# Patient Record
Sex: Female | Born: 1980 | Race: Black or African American | Hispanic: No | Marital: Single | Smoking: Former smoker
Health system: Southern US, Community
[De-identification: ages and names within clinical notes are randomized; demographics above are authoritative.]

## PROBLEM LIST (undated history)

## (undated) DIAGNOSIS — R87629 Unspecified abnormal cytological findings in specimens from vagina: Secondary | ICD-10-CM

## (undated) DIAGNOSIS — R569 Unspecified convulsions: Secondary | ICD-10-CM

## (undated) DIAGNOSIS — F419 Anxiety disorder, unspecified: Secondary | ICD-10-CM

## (undated) DIAGNOSIS — N83209 Unspecified ovarian cyst, unspecified side: Secondary | ICD-10-CM

## (undated) DIAGNOSIS — F329 Major depressive disorder, single episode, unspecified: Secondary | ICD-10-CM

## (undated) DIAGNOSIS — F32A Depression, unspecified: Secondary | ICD-10-CM

## (undated) DIAGNOSIS — G43909 Migraine, unspecified, not intractable, without status migrainosus: Secondary | ICD-10-CM

## (undated) HISTORY — DX: Unspecified abnormal cytological findings in specimens from vagina: R87.629

## (undated) HISTORY — DX: Unspecified convulsions: R56.9

## (undated) HISTORY — DX: Migraine, unspecified, not intractable, without status migrainosus: G43.909

## (undated) HISTORY — PX: WISDOM TOOTH EXTRACTION: SHX21

---

## 2006-02-14 DIAGNOSIS — R87629 Unspecified abnormal cytological findings in specimens from vagina: Secondary | ICD-10-CM

## 2006-02-14 HISTORY — PX: GYNECOLOGIC CRYOSURGERY: SHX857

## 2006-02-14 HISTORY — DX: Unspecified abnormal cytological findings in specimens from vagina: R87.629

## 2016-11-12 DIAGNOSIS — F329 Major depressive disorder, single episode, unspecified: Secondary | ICD-10-CM | POA: Insufficient documentation

## 2016-11-12 DIAGNOSIS — F419 Anxiety disorder, unspecified: Secondary | ICD-10-CM | POA: Insufficient documentation

## 2016-11-12 DIAGNOSIS — F32A Depression, unspecified: Secondary | ICD-10-CM | POA: Insufficient documentation

## 2016-12-30 ENCOUNTER — Emergency Department (HOSPITAL_COMMUNITY)
Admission: EM | Admit: 2016-12-30 | Discharge: 2016-12-30 | Disposition: A | Discharge status: Home / Self Care | Payer: Managed Care, Other (non HMO) | Attending: Emergency Medicine | Admitting: Emergency Medicine

## 2016-12-30 ENCOUNTER — Emergency Department (HOSPITAL_COMMUNITY): Payer: Managed Care, Other (non HMO)

## 2016-12-30 ENCOUNTER — Encounter (HOSPITAL_COMMUNITY): Payer: Self-pay | Admitting: Nurse Practitioner

## 2016-12-30 DIAGNOSIS — R1031 Right lower quadrant pain: Secondary | ICD-10-CM | POA: Diagnosis present

## 2016-12-30 DIAGNOSIS — R112 Nausea with vomiting, unspecified: Secondary | ICD-10-CM | POA: Insufficient documentation

## 2016-12-30 DIAGNOSIS — N83201 Unspecified ovarian cyst, right side: Secondary | ICD-10-CM | POA: Diagnosis not present

## 2016-12-30 DIAGNOSIS — F17210 Nicotine dependence, cigarettes, uncomplicated: Secondary | ICD-10-CM | POA: Diagnosis not present

## 2016-12-30 HISTORY — DX: Depression, unspecified: F32.A

## 2016-12-30 HISTORY — DX: Anxiety disorder, unspecified: F41.9

## 2016-12-30 HISTORY — DX: Major depressive disorder, single episode, unspecified: F32.9

## 2016-12-30 LAB — CBC
HCT: 39.7 % (ref 36.0–46.0)
Hemoglobin: 13.6 g/dL (ref 12.0–15.0)
MCH: 30.4 pg (ref 26.0–34.0)
MCHC: 34.3 g/dL (ref 30.0–36.0)
MCV: 88.8 fL (ref 78.0–100.0)
PLATELETS: 178 10*3/uL (ref 150–400)
RBC: 4.47 MIL/uL (ref 3.87–5.11)
RDW: 12.3 % (ref 11.5–15.5)
WBC: 4.7 10*3/uL (ref 4.0–10.5)

## 2016-12-30 LAB — URINALYSIS, ROUTINE W REFLEX MICROSCOPIC
BILIRUBIN URINE: NEGATIVE
Glucose, UA: NEGATIVE mg/dL
Ketones, ur: NEGATIVE mg/dL
LEUKOCYTES UA: NEGATIVE
NITRITE: NEGATIVE
PH: 5 (ref 5.0–8.0)
Protein, ur: NEGATIVE mg/dL
SPECIFIC GRAVITY, URINE: 1.025 (ref 1.005–1.030)

## 2016-12-30 LAB — COMPREHENSIVE METABOLIC PANEL
ALBUMIN: 4.1 g/dL (ref 3.5–5.0)
ALK PHOS: 67 U/L (ref 38–126)
ALT: 15 U/L (ref 14–54)
AST: 21 U/L (ref 15–41)
Anion gap: 6 (ref 5–15)
BILIRUBIN TOTAL: 0.5 mg/dL (ref 0.3–1.2)
BUN: 10 mg/dL (ref 6–20)
CALCIUM: 9.3 mg/dL (ref 8.9–10.3)
CO2: 24 mmol/L (ref 22–32)
CREATININE: 0.95 mg/dL (ref 0.44–1.00)
Chloride: 106 mmol/L (ref 101–111)
GFR calc Af Amer: >60 mL/min (ref >60)
GLUCOSE: 102 mg/dL — AB (ref 65–99)
POTASSIUM: 3.5 mmol/L (ref 3.5–5.1)
Sodium: 136 mmol/L (ref 135–145)
TOTAL PROTEIN: 7 g/dL (ref 6.5–8.1)

## 2016-12-30 LAB — I-STAT BETA HCG BLOOD, ED (MC, WL, AP ONLY)

## 2016-12-30 LAB — LIPASE, BLOOD: Lipase: 45 U/L (ref 11–51)

## 2016-12-30 MED ORDER — KETOROLAC TROMETHAMINE 15 MG/ML IJ SOLN
15.0000 mg | Freq: Once | INTRAMUSCULAR | Status: AC
  Administered 2016-12-30: 15 mg via INTRAVENOUS
  Filled 2016-12-30: qty 1

## 2016-12-30 MED ORDER — SODIUM CHLORIDE 0.9 % IV BOLUS (SEPSIS)
1000.0000 mL | Freq: Once | INTRAVENOUS | Status: AC
  Administered 2016-12-30: 1000 mL via INTRAVENOUS

## 2016-12-30 MED ORDER — ONDANSETRON HCL 4 MG/2ML IJ SOLN
4.0000 mg | Freq: Once | INTRAMUSCULAR | Status: AC
  Administered 2016-12-30: 4 mg via INTRAVENOUS
  Filled 2016-12-30: qty 2

## 2016-12-30 MED ORDER — NAPROXEN 500 MG PO TABS: 500.0000 mg | ORAL_TABLET | Freq: Two times a day (BID) | ORAL | 0 refills | Status: AC | PRN

## 2016-12-30 MED ORDER — HYDROCODONE-ACETAMINOPHEN 5-325 MG PO TABS
2.0000 | ORAL_TABLET | Freq: Once | ORAL | Status: DC
  Filled 2016-12-30: qty 2

## 2016-12-30 MED ORDER — DIPHENHYDRAMINE HCL 50 MG/ML IJ SOLN: 12.5000 mg | Freq: Once | INTRAMUSCULAR | Status: DC

## 2016-12-30 MED ORDER — ONDANSETRON 4 MG PO TBDP
4.0000 mg | ORAL_TABLET | Freq: Once | ORAL | Status: AC | PRN
  Administered 2016-12-30: 4 mg via ORAL
  Filled 2016-12-30: qty 1

## 2016-12-30 MED ORDER — MORPHINE SULFATE (PF) 4 MG/ML IV SOLN
4.0000 mg | Freq: Once | INTRAVENOUS | Status: AC
  Administered 2016-12-30: 4 mg via INTRAVENOUS
  Filled 2016-12-30: qty 1

## 2016-12-30 MED ORDER — IOPAMIDOL (ISOVUE-300) INJECTION 61%
INTRAVENOUS | Status: AC
  Administered 2016-12-30: 100 mL
  Filled 2016-12-30: qty 100

## 2016-12-30 MED ORDER — METOCLOPRAMIDE HCL 5 MG/ML IJ SOLN: 10.0000 mg | Freq: Once | INTRAMUSCULAR | Status: DC

## 2016-12-30 MED ORDER — HYDROCODONE-ACETAMINOPHEN 5-325 MG PO TABS: 1.0000 | ORAL_TABLET | ORAL | 0 refills | Status: DC | PRN

## 2016-12-30 MED ORDER — ONDANSETRON 4 MG PO TBDP: 4.0000 mg | ORAL_TABLET | Freq: Three times a day (TID) | ORAL | 0 refills | Status: DC | PRN

## 2016-12-30 NOTE — ED Provider Notes (Signed)
[facility]   CSN: [id number] Arrival date & time: 12/30/16  [id number]     History   Chief Complaint Chief Complaint  Patient presents with  . Abdominal Pain    HPI Chelsea Wolf is a 36 y.o. female.  HPI   36 yo F with PMHx anxiety, depression here with abdominal pain. Pt reports several days of progressively worsening RLQ abdominal pain. Pt reports aching, gnawing, severe RLQ pain with associated nausea, vomiting. Pain is worse with eating and palpation. No worsening factors. No fever or chills. No vaginal bleeding or discharge. No flank pain. No h/o gallstones and had neg U/S several months ago according to her report. She was seen by her PCP and sent here for CT/evaluation. No h/o abdominal surgeries.  Past Medical History:  Diagnosis Date  . Anxiety   . Depression     There are no active problems to display for this patient.   Past Surgical History:  Procedure Laterality Date  . WISDOM TOOTH EXTRACTION      OB History    No data available       Home Medications    Prior to Admission medications   Medication Sig Start Date End Date Taking? Authorizing Provider  HYDROcodone-acetaminophen (NORCO/VICODIN) 5-325 MG tablet Take 1-2 tablets every 4 (four) hours as needed by mouth for moderate pain or severe pain. 12/30/16   Shaune PollackIsaacs, Sakari Raisanen, MD  naproxen (NAPROSYN) 500 MG tablet Take 1 tablet (500 mg total) 2 (two) times daily as needed for up to 7 days by mouth for moderate pain. 12/30/16 01/06/17  Shaune PollackIsaacs, Jahnavi Muratore, MD  ondansetron (ZOFRAN ODT) 4 MG disintegrating tablet Take 1 tablet (4 mg total) every 8 (eight) hours as needed by mouth for nausea or vomiting. 12/30/16   Shaune PollackIsaacs, Kelley Polinsky, MD    Family History No family history on file.  Social History Social History   Tobacco Use  . Smoking status: Light Tobacco Smoker  . Smokeless tobacco: Never Used  Substance Use Topics  . Alcohol use: Yes    Comment:  occasionally   . Drug use: No     Allergies   Demerol [meperidine]   Review of Systems Review of Systems  Constitutional: Positive for fatigue. Negative for chills and fever.  HENT: Negative for congestion and rhinorrhea.   Eyes: Negative for visual disturbance.  Respiratory: Negative for cough, shortness of breath and wheezing.   Cardiovascular: Negative for chest pain and leg swelling.  Gastrointestinal: Positive for abdominal pain and nausea. Negative for diarrhea and vomiting.  Genitourinary: Negative for dysuria and flank pain.  Musculoskeletal: Negative for neck pain and neck stiffness.  Skin: Negative for rash and wound.  Allergic/Immunologic: Negative for immunocompromised state.  Neurological: Negative for syncope, weakness and headaches.  All other systems reviewed and are negative.    Physical Exam Updated Vital Signs BP 116/78   Pulse 78   Temp 98.7 F (37.1 C)   Resp 16   Ht 5\' 4"  (1.626 m)   Wt 76.2 kg (168 lb)   LMP 12/05/2016 (Approximate)   SpO2 99%   BMI 28.84 kg/m   Physical Exam  Constitutional: She is oriented to person, place, and time. She appears well-developed and well-nourished. No distress.  HENT:  Head: Normocephalic and atraumatic.  Eyes: Conjunctivae are normal.  Neck: Neck supple.  Cardiovascular: Normal rate, regular rhythm and normal heart sounds. Exam reveals no friction rub.  No murmur heard. Pulmonary/Chest: Effort normal and breath sounds  normal. No respiratory distress. She has no wheezes. She has no rales.  Abdominal: Soft. Normal appearance. She exhibits no distension. There is tenderness in the right upper quadrant and right lower quadrant. There is guarding and tenderness at McBurney's point. There is negative Murphy's sign.  Musculoskeletal: She exhibits no edema.  Neurological: She is alert and oriented to person, place, and time. She exhibits normal muscle tone.  Skin: Skin is warm. Capillary refill takes less than 2  seconds.  Psychiatric: She has a normal mood and affect.  Nursing note and vitals reviewed.    ED Treatments / Results  Labs (all labs ordered are listed, but only abnormal results are displayed) Labs Reviewed  COMPREHENSIVE METABOLIC PANEL - Abnormal; Notable for the following components:      Result Value   Glucose, Bld 102 (*)    All other components within normal limits  URINALYSIS, ROUTINE W REFLEX MICROSCOPIC - Abnormal; Notable for the following components:   APPearance HAZY (*)    Hgb urine dipstick MODERATE (*)    Bacteria, UA RARE (*)    Squamous Epithelial / LPF 6-30 (*)    All other components within normal limits  LIPASE, BLOOD  CBC  I-STAT BETA HCG BLOOD, ED ([facility], [facility], [facility] ONLY)    EKG  EKG Interpretation None       Radiology Ct Abdomen Pelvis W Contrast  Result Date: 12/30/2016 CLINICAL DATA:  Right abdominal pain for the past week. Nausea and vomiting today. EXAM: CT ABDOMEN AND PELVIS WITH CONTRAST TECHNIQUE: Multidetector CT imaging of the abdomen and pelvis was performed using the standard protocol following bolus administration of intravenous contrast. CONTRAST:  ISOVUE-300 IOPAMIDOL (ISOVUE-300) INJECTION 61% COMPARISON:  None. FINDINGS: Lower chest: Unremarkable. Hepatobiliary: Mild diffuse low density of the liver relative to the spleen. Focal fat deposition in the medial segment of the left lobe of the liver adjacent to the falciform ligament. Normal appearing gallbladder. Pancreas: Unremarkable. No pancreatic ductal dilatation or surrounding inflammatory changes. Spleen: Normal in size without focal abnormality. Adrenals/Urinary Tract: Adrenal glands are unremarkable. Kidneys are normal, without renal calculi, focal lesion, or hydronephrosis. Bladder is unremarkable. Stomach/Bowel: Stomach is within normal limits. Appendix appears normal. No evidence of bowel wall thickening, distention, or inflammatory changes. Vascular/Lymphatic: No significant  vascular findings are present. No enlarged abdominal or pelvic lymph nodes. Reproductive: Uterus and bilateral adnexa are unremarkable. Other: Small moderate amount of free peritoneal fluid in the pelvic cul-de-sac. Small umbilical hernia containing fat. Musculoskeletal: Normal appearing bones. IMPRESSION: 1. No acute abnormality. 2. Small to moderate amount of free peritoneal fluid in the pelvic cul-de-sac, most likely due to recent ovarian cyst rupture. 3. Mild diffuse hepatic steatosis. Electronically Signed   By: Beckie Salts M.D.   On: 12/30/2016 17:21    Procedures Procedures (including critical care time)  Medications Ordered in ED Medications  ketorolac (TORADOL) 15 MG/ML injection 15 mg (not administered)  HYDROcodone-acetaminophen (NORCO/VICODIN) 5-325 MG per tablet 2 tablet (not administered)  ondansetron (ZOFRAN-ODT) disintegrating tablet 4 mg (4 mg Oral Given 12/30/16 1007)  morphine 4 MG/ML injection 4 mg (4 mg Intravenous Given 12/30/16 1419)  ondansetron (ZOFRAN) injection 4 mg (4 mg Intravenous Given 12/30/16 1420)  sodium chloride 0.9 % bolus 1,000 mL (0 mLs Intravenous Stopped 12/30/16 1530)  iopamidol (ISOVUE-300) 61 % injection (100 mLs  Contrast Given 12/30/16 1641)     Initial Impression / Assessment and Plan / ED Course  I have reviewed the triage vital signs and the  nursing notes.  Pertinent labs & imaging results that were available during my care of the patient were reviewed by me and considered in my medical decision making (see chart for details).     36 year old female here with right flank and lower quadrant pain.  No vaginal bleeding or discharge and she is not sexually active.  On arrival, vital signs are stable.  Lab work is very reassuring.  Given undifferentiated nature of her pain, CT scan obtained and is consistent with ruptured right ovarian cyst.  Her pain is constant, not colicky, and there are no large cysts or ovarian masses to suggest ovarian  torsion.  She has no vaginal bleeding, discharge, or evidence to suggest PID.  Her current menstrual cycle is consistent with suspected cyst.  I discussed the need for outpatient follow-up and possible initiation of OCPs with the patient in detail.  She does have an extensive history of painful menses.  Otherwise, will give NSAIDs, brief course of analgesia as needed, and discharged home with good return precautions.  This note was prepared with assistance of Conservation officer, historic buildingsDragon voice recognition software. Occasional wrong-word or sound-a-like substitutions may have occurred due to the inherent limitations of voice recognition software.   Final Clinical Impressions(s) / ED Diagnoses   Final diagnoses:  Cyst of right ovary    ED Discharge Orders        Ordered    HYDROcodone-acetaminophen (NORCO/VICODIN) 5-325 MG tablet  Every 4 hours PRN     12/30/16 1735    ondansetron (ZOFRAN ODT) 4 MG disintegrating tablet  Every 8 hours PRN     12/30/16 1735    naproxen (NAPROSYN) 500 MG tablet  2 times daily PRN     12/30/16 1735       Shaune PollackIsaacs, Joshuwa Vecchio, MD 12/30/16 1741

## 2016-12-30 NOTE — ED Notes (Signed)
Patient presents from PCP for over 1 week of right sided abdominal pain increasing in frequency and intensity. Pt sts last night has frequent emesis and has nausea presently. Pt in NAD, ambulatory, denies fevers and chills.

## 2016-12-30 NOTE — ED Notes (Signed)
Pt returned from CT at this time.  

## 2017-01-20 ENCOUNTER — Other Ambulatory Visit: Payer: Self-pay

## 2017-01-20 ENCOUNTER — Emergency Department (HOSPITAL_COMMUNITY): Payer: Managed Care, Other (non HMO)

## 2017-01-20 ENCOUNTER — Encounter (HOSPITAL_COMMUNITY): Payer: Self-pay

## 2017-01-20 ENCOUNTER — Emergency Department (HOSPITAL_COMMUNITY)
Admission: EM | Admit: 2017-01-20 | Discharge: 2017-01-20 | Disposition: A | Discharge status: Home / Self Care | Payer: Managed Care, Other (non HMO) | Attending: Emergency Medicine | Admitting: Emergency Medicine

## 2017-01-20 DIAGNOSIS — Z79899 Other long term (current) drug therapy: Secondary | ICD-10-CM | POA: Diagnosis not present

## 2017-01-20 DIAGNOSIS — R1084 Generalized abdominal pain: Secondary | ICD-10-CM | POA: Diagnosis not present

## 2017-01-20 DIAGNOSIS — F17210 Nicotine dependence, cigarettes, uncomplicated: Secondary | ICD-10-CM | POA: Diagnosis not present

## 2017-01-20 HISTORY — DX: Unspecified ovarian cyst, unspecified side: N83.209

## 2017-01-20 LAB — URINALYSIS, ROUTINE W REFLEX MICROSCOPIC
BILIRUBIN URINE: NEGATIVE
Glucose, UA: NEGATIVE mg/dL
KETONES UR: NEGATIVE mg/dL
Nitrite: NEGATIVE
Protein, ur: NEGATIVE mg/dL
SPECIFIC GRAVITY, URINE: 1.021 (ref 1.005–1.030)
pH: 6 (ref 5.0–8.0)

## 2017-01-20 LAB — COMPREHENSIVE METABOLIC PANEL
ALBUMIN: 3.9 g/dL (ref 3.5–5.0)
ALT: 21 U/L (ref 14–54)
AST: 22 U/L (ref 15–41)
Alkaline Phosphatase: 62 U/L (ref 38–126)
Anion gap: 8 (ref 5–15)
BUN: 8 mg/dL (ref 6–20)
CHLORIDE: 105 mmol/L (ref 101–111)
CO2: 25 mmol/L (ref 22–32)
Calcium: 9.3 mg/dL (ref 8.9–10.3)
Creatinine, Ser: 0.97 mg/dL (ref 0.44–1.00)
GFR calc Af Amer: >60 mL/min (ref >60)
GLUCOSE: 133 mg/dL — AB (ref 65–99)
POTASSIUM: 3.6 mmol/L (ref 3.5–5.1)
SODIUM: 138 mmol/L (ref 135–145)
Total Bilirubin: 0.3 mg/dL (ref 0.3–1.2)
Total Protein: 6.5 g/dL (ref 6.5–8.1)

## 2017-01-20 LAB — CBC
HEMATOCRIT: 37.7 % (ref 36.0–46.0)
Hemoglobin: 12.8 g/dL (ref 12.0–15.0)
MCH: 30.3 pg (ref 26.0–34.0)
MCHC: 34 g/dL (ref 30.0–36.0)
MCV: 89.1 fL (ref 78.0–100.0)
Platelets: 193 10*3/uL (ref 150–400)
RBC: 4.23 MIL/uL (ref 3.87–5.11)
RDW: 12.2 % (ref 11.5–15.5)
WBC: 4.9 10*3/uL (ref 4.0–10.5)

## 2017-01-20 LAB — I-STAT BETA HCG BLOOD, ED (MC, WL, AP ONLY)

## 2017-01-20 LAB — LIPASE, BLOOD: LIPASE: 48 U/L (ref 11–51)

## 2017-01-20 MED ORDER — KETOROLAC TROMETHAMINE 30 MG/ML IJ SOLN: 30.0000 mg | Freq: Once | INTRAMUSCULAR | Status: DC

## 2017-01-20 MED ORDER — HYDROCODONE-ACETAMINOPHEN 5-325 MG PO TABS: 2.0000 | ORAL_TABLET | ORAL | 0 refills | Status: DC | PRN

## 2017-01-20 MED ORDER — KETOROLAC TROMETHAMINE 60 MG/2ML IM SOLN
60.0000 mg | Freq: Once | INTRAMUSCULAR | Status: AC
  Administered 2017-01-20: 60 mg via INTRAMUSCULAR
  Filled 2017-01-20: qty 2

## 2017-01-20 MED ORDER — METHOCARBAMOL 750 MG PO TABS: 750.0000 mg | ORAL_TABLET | Freq: Four times a day (QID) | ORAL | 0 refills | Status: DC

## 2017-01-20 NOTE — ED Notes (Signed)
Pt ambulated to the restroom and back independently.

## 2017-01-20 NOTE — Discharge Instructions (Signed)
Return here at once for fever, vomiting, worsening pain, or any other problems

## 2017-01-20 NOTE — ED Provider Notes (Signed)
[facility]   CSN: [id number] Arrival date & time: 01/20/17  1019     History   Chief Complaint Chief Complaint  Patient presents with  . Abdominal Pain    HPI Zuha Vonita MossSteward is a 36 y.o. female.  36 year old female presents with recurrent right lower quadrant abdominal pain times several weeks.  Pain became worse for the past 2 days.  Pain characterized as dull and achy and persistent.  Some associated nausea and vomiting but no fever or chills.  No vaginal bleeding or discharge.  No dysuria or hematuria.  Has been seen for similar symptoms several weeks ago and had abdominal CT which was positive for an ovarian cyst on the right side.  Has been using over-the-counter pain medication without relief.      Past Medical History:  Diagnosis Date  . Anxiety   . Depression   . Ovarian cyst     There are no active problems to display for this patient.   Past Surgical History:  Procedure Laterality Date  . WISDOM TOOTH EXTRACTION      OB History    No data available       Home Medications    Prior to Admission medications   Medication Sig Start Date End Date Taking? Authorizing Provider  cephALEXin (KEFLEX) 250 MG capsule Take 250 mg by mouth 4 (four) times daily. 01/12/17 01/21/17 Yes [provider]  Norgestimate-Ethinyl Estradiol Triphasic (TRI-SPRINTEC) 0.18/0.215/0.25 MG-35 MCG tablet Take 1 tablet by mouth daily.   Yes [provider]  HYDROcodone-acetaminophen (NORCO/VICODIN) 5-325 MG tablet Take 1-2 tablets every 4 (four) hours as needed by mouth for moderate pain or severe pain. Patient not taking: Reported on 01/20/2017 12/30/16   Shaune PollackIsaacs, Cameron, MD  ondansetron (ZOFRAN ODT) 4 MG disintegrating tablet Take 1 tablet (4 mg total) every 8 (eight) hours as needed by mouth for nausea or vomiting. Patient not taking: Reported on 01/20/2017 12/30/16   Shaune PollackIsaacs, Cameron, MD    Family History No  family history on file.  Social History Social History   Tobacco Use  . Smoking status: Light Tobacco Smoker    Types: Cigarettes  . Smokeless tobacco: Never Used  Substance Use Topics  . Alcohol use: Yes    Comment: occasionally   . Drug use: No     Allergies   Demerol [meperidine]   Review of Systems Review of Systems  All other systems reviewed and are negative.    Physical Exam Updated Vital Signs BP 113/70   Pulse 82   Temp 98.7 F (37.1 C)   Resp 18   Ht 1.626 m (5\' 4" )   Wt 72.6 kg (160 lb)   LMP 01/03/2017   SpO2 95%   BMI 27.46 kg/m   Physical Exam  Constitutional: She is oriented to person, place, and time. She appears well-developed and well-nourished.  Non-toxic appearance. No distress.  HENT:  Head: Normocephalic and atraumatic.  Eyes: Conjunctivae, EOM and lids are normal. Pupils are equal, round, and reactive to light.  Neck: Normal range of motion. Neck supple. No tracheal deviation present. No thyroid mass present.  Cardiovascular: Normal rate, regular rhythm and normal heart sounds. Exam reveals no gallop.  No murmur heard. Pulmonary/Chest: Effort normal and breath sounds normal. No stridor. No respiratory distress. She has no decreased breath sounds. She has no wheezes. She has no rhonchi. She has no rales.  Abdominal: Soft. Normal appearance and bowel sounds are normal. She exhibits  no distension. There is tenderness in the right lower quadrant. There is no rigidity, no rebound, no guarding and no CVA tenderness.    Musculoskeletal: Normal range of motion. She exhibits no edema or tenderness.  Neurological: She is alert and oriented to person, place, and time. She has normal strength. No cranial nerve deficit or sensory deficit. GCS eye subscore is 4. GCS verbal subscore is 5. GCS motor subscore is 6.  Skin: Skin is warm and dry. No abrasion and no rash noted.  Psychiatric: She has a normal mood and affect. Her speech is normal and behavior  is normal.  Nursing note and vitals reviewed.    ED Treatments / Results  Labs (all labs ordered are listed, but only abnormal results are displayed) Labs Reviewed  COMPREHENSIVE METABOLIC PANEL - Abnormal; Notable for the following components:      Result Value   Glucose, Bld 133 (*)    All other components within normal limits  URINALYSIS, ROUTINE W REFLEX MICROSCOPIC - Abnormal; Notable for the following components:   APPearance CLOUDY (*)    Hgb urine dipstick SMALL (*)    Leukocytes, UA MODERATE (*)    Bacteria, UA RARE (*)    Squamous Epithelial / LPF 6-30 (*)    All other components within normal limits  LIPASE, BLOOD  CBC  I-STAT BETA HCG BLOOD, ED ([facility], [facility], [facility] ONLY)    EKG  EKG Interpretation None       Radiology No results found.  Procedures Procedures (including critical care time)  Medications Ordered in ED Medications  ketorolac (TORADOL) 30 MG/ML injection 30 mg (not administered)     Initial Impression / Assessment and Plan / ED Course  I have reviewed the triage vital signs and the nursing notes.  Pertinent labs & imaging results that were available during my care of the patient were reviewed by me and considered in my medical decision making (see chart for details).     Patient medicated for pain with Toradol here.  Pelvic ultrasound was negative for acute process.  Patient states that she has had this similar pain now for several months.  She has had multiple evaluations including a CAT scan which was without any etiology for his symptoms.  CBC without leukocytosis.  Urinalysis noted.  Appears to be contaminated.  Red blood cells noted but not concerned for kidney stone.  Will prescribe medication here for pain and will follow with her doctor  Final Clinical Impressions(s) / ED Diagnoses   Final diagnoses:  None    ED Discharge Orders    None       Lorre NickAllen, Breana Litts, MD 01/20/17 1724

## 2017-01-20 NOTE — ED Notes (Signed)
Pt at ultrasound

## 2017-01-20 NOTE — ED Triage Notes (Signed)
Per Pt, Pt is coming from home with complaints of right sided abdominal pain that started approximately a couple months ago, but has gotten worse. Pt was diagnosed with cysts and sent home. Pt reports increase pain and nausea and vomiting.

## 2017-01-20 NOTE — ED Notes (Signed)
Patient able to ambulate independently  

## 2017-01-27 ENCOUNTER — Encounter: Payer: Self-pay | Admitting: *Deleted

## 2017-01-30 ENCOUNTER — Ambulatory Visit (INDEPENDENT_AMBULATORY_CARE_PROVIDER_SITE_OTHER): Payer: Managed Care, Other (non HMO) | Admitting: Diagnostic Neuroimaging

## 2017-01-30 ENCOUNTER — Encounter: Payer: Self-pay | Admitting: Diagnostic Neuroimaging

## 2017-01-30 ENCOUNTER — Encounter (INDEPENDENT_AMBULATORY_CARE_PROVIDER_SITE_OTHER): Payer: Self-pay

## 2017-01-30 VITALS — BP 117/80 | HR 82 | Ht 64.0 in | Wt 168.4 lb

## 2017-01-30 DIAGNOSIS — R4689 Other symptoms and signs involving appearance and behavior: Secondary | ICD-10-CM

## 2017-01-30 DIAGNOSIS — R413 Other amnesia: Secondary | ICD-10-CM | POA: Diagnosis not present

## 2017-01-30 DIAGNOSIS — R404 Transient alteration of awareness: Secondary | ICD-10-CM | POA: Diagnosis not present

## 2017-01-30 NOTE — Patient Instructions (Signed)
Thank you for coming to see [place] at [facility]. I hope we have been able to provide you high quality care today.  You may receive a patient satisfaction survey over the next few weeks. We would appreciate your feedback and comments so that we may continue to improve ourselves and the health of our patients.   - check MRI brain and EEG  - please request your medical records from [place]  - caution with driving    ~~~~~~~~~~~~~~~~~~~~~~~~~~~~~~~~~~~~~~~~~~~~~~~~~~~~~~~~~~~~~~~~~  DR. Dougles Kimmey'S GUIDE TO HAPPY AND HEALTHY LIVING These are some of my general health and wellness recommendations. Some of them may apply to you better than others. Please use common sense as you try these suggestions and feel free to ask me any questions.   ACTIVITY/FITNESS Mental, social, emotional and physical stimulation are very important for brain and body health. Try learning a new activity (arts, music, language, sports, games).  Keep moving your body to the best of your abilities. You can do this at home, inside or outside, the park, community center, gym or anywhere you like. Consider a physical therapist or personal trainer to get started. Consider the app Sworkit. Fitness trackers such as smart-watches, smart-phones or Fitbits can help as well.   NUTRITION Eat more plants: colorful vegetables, nuts, seeds and berries.  Eat less sugar, salt, preservatives and processed foods.  Avoid toxins such as cigarettes and alcohol.  Drink water when you are thirsty. Warm water with a slice of lemon is an excellent morning drink to start the day.  Consider these websites for more information The Nutrition Source (https://www.henry-hernandez.biz/) [employer] Nutrition (WindowBlog.ch)   RELAXATION Consider practicing mindfulness meditation or other relaxation techniques such as deep breathing, prayer, yoga, tai chi, massage. See website  mindful.org or the apps Headspace or Calm to help get started.   SLEEP Try to get at least 7-8+ hours sleep per day. Regular exercise and reduced caffeine will help you sleep better. Practice good sleep hygeine techniques. See website sleep.org for more information.   PLANNING Prepare estate planning, living will, healthcare POA documents. Sometimes this is best planned with the help of an attorney. Theconversationproject.org and agingwithdignity.org are excellent resources.

## 2017-01-30 NOTE — Progress Notes (Signed)
[facility]  PATIENT: Chelsea Wolf DOB: 12/20/1980  REFERRING CLINICIAN: Ronney LionMarian Hazy, FNP HISTORY FROM: patient  REASON FOR VISIT: new consult    HISTORICAL  CHIEF COMPLAINT:  Chief Complaint  Patient presents with  . 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  March 2014 patient had first event when she was laying in bed at the end of the day felt shortness of breath stiff all over and panic attack sensation.  [place] in [place] at this time.  Patient had multiple events.  She had extensive evaluation was diagnosed with "stress related seizures" but not epilepsy.  These were managed conservatively over time.  [place]  She has had 2 more events similar to the ones in the past.  They always occur at home at the end of the day when she is laying down.  They have never occurred during the daytime.  Never occurred during driving.  She has bitten her tongue one time.  No incontinence.   REVIEW OF SYSTEMS: Full 14 system review of systems performed and negative with exception of: Weight loss fatigue eye pain memory loss confusion headache dizziness seizure passing out insomnia depression anxiety hallucinations racing thoughts disinterest in activities runny nose joint pain diarrhea feeling hot shortness of breath ringing in ears spinning sensation.  ALLERGIES: Allergies  Allergen Reactions  . Demerol [Meperidine] Other (See Comments)    "seizure-like" activity     HOME MEDICATIONS: Outpatient Medications Prior to Visit  Medication Sig Dispense Refill  . HYDROcodone-acetaminophen (NORCO/VICODIN) 5-325 MG tablet Take 2  tablets by mouth every 4 (four) hours as needed. 10 tablet 0  . methocarbamol (ROBAXIN-750) 750 MG tablet Take 1 tablet (750 mg total) by mouth 4 (four) times daily. 30 tablet 0  . Norgestimate-Ethinyl Estradiol Triphasic (TRI-SPRINTEC) 0.18/0.215/0.25 MG-35 MCG tablet Take 1 tablet by mouth daily.    . ondansetron (ZOFRAN ODT) 4 MG disintegrating tablet Take 1 tablet (4 mg total) every 8 (eight) hours as needed by mouth for nausea or vomiting. 20 tablet 0  . HYDROcodone-acetaminophen (NORCO/VICODIN) 5-325 MG tablet Take 1-2 tablets every 4 (four) hours as needed by mouth for moderate pain or severe pain. 12 tablet 0   No facility-administered medications prior to visit.     PAST MEDICAL HISTORY: Past Medical History:  Diagnosis Date  . Anxiety   . Depression   . Migraine   . Ovarian cyst   . Seizures (HCC)    last sz 01/26/17    PAST SURGICAL HISTORY: Past Surgical History:  Procedure Laterality Date  . WISDOM TOOTH EXTRACTION      FAMILY HISTORY: Family History  Problem Relation Age of Onset  . Depression Mother   . Graves' disease Mother   . Depression Father   . Heart disease Maternal Grandmother   . Hypertension Maternal Grandmother     SOCIAL HISTORY:  Social History   Socioeconomic History  . Marital status: Single    Spouse name: Not on file  . Number of children: Not on file  . Years of education: 7214  . Highest education level: Not on file  Social Needs  . Financial resource strain: Not on  file  . Food insecurity - worry: Not on file  . Food insecurity - inability: Not on file  . Transportation needs - medical: Not on file  . Transportation needs - non-medical: Not on file  Occupational History  . Not on file  Tobacco Use  . Smoking status: Light Tobacco Smoker    Types: Cigarettes  . Smokeless tobacco: Never Used  . Tobacco comment: 01/30/17 mainly at work  Substance and Sexual Activity  . Alcohol use: Yes    Comment: occasionally   . Drug use:  No  . Sexual activity: Not on file  Other Topics Concern  . Not on file  Social History Narrative   Lives with boyfriend   Caffeine- 1-2 20 oz Pepsis   1 child   Some college   [employer]        PHYSICAL EXAM  GENERAL EXAM/CONSTITUTIONAL: Vitals:  Vitals:   01/30/17 0805  BP: 117/80  Pulse: 82  Weight: 168 lb 6.4 oz (76.4 kg)  Height: 5\' 4"  (1.626 m)     Body mass index is 28.91 kg/m.  No exam data present  Patient is in no distress; well developed, nourished and groomed; neck is supple  CARDIOVASCULAR:  Examination of carotid arteries is normal; no carotid bruits  Regular rate and rhythm, no murmurs  Examination of peripheral vascular system by observation and palpation is normal  EYES:  Ophthalmoscopic exam of optic discs and posterior segments is normal; no papilledema or hemorrhages  MUSCULOSKELETAL:  Gait, strength, tone, movements noted in Neurologic exam below  NEUROLOGIC: MENTAL STATUS:  No flowsheet data found.  awake, alert, oriented to person, place and time  recent and remote memory intact  normal attention and concentration  language fluent, comprehension intact, naming intact,   fund of knowledge appropriate  CRANIAL NERVE:   2nd - no papilledema on fundoscopic exam  2nd, 3rd, 4th, 6th - pupils equal and reactive to light, visual fields full to confrontation, extraocular muscles intact, no nystagmus  5th - facial sensation symmetric  7th - facial strength symmetric  8th - hearing intact  9th - palate elevates symmetrically, uvula midline  11th - shoulder shrug symmetric  12th - tongue protrusion midline  MOTOR:   normal bulk and tone, full strength in the BUE, BLE  SENSORY:   normal and symmetric to light touch, temperature, vibration  COORDINATION:   finger-nose-finger, fine finger movements normal  REFLEXES:   deep tendon reflexes present and symmetric  GAIT/STATION:   narrow based gait;  able to walk on toes, heels and tandem; romberg is negative    DIAGNOSTIC DATA (LABS, IMAGING, TESTING) - I reviewed patient records, labs, notes, testing and imaging myself where available.  Lab Results  Component Value Date   WBC 4.9 01/20/2017   HGB 12.8 01/20/2017   HCT 37.7 01/20/2017   MCV 89.1 01/20/2017   PLT 193 01/20/2017      Component Value Date/Time   NA 138 01/20/2017 1045   K 3.6 01/20/2017 1045   CL 105 01/20/2017 1045   CO2 25 01/20/2017 1045   GLUCOSE 133 (H) 01/20/2017 1045   BUN 8 01/20/2017 1045   CREATININE 0.97 01/20/2017 1045   CALCIUM 9.3 01/20/2017 1045   PROT 6.5 01/20/2017 1045   ALBUMIN 3.9 01/20/2017 1045   AST 22 01/20/2017 1045   ALT 21 01/20/2017 1045   ALKPHOS 62 01/20/2017 1045   BILITOT 0.3 01/20/2017 1045   GFRNONAA >60 01/20/2017 1045   GFRAA >  60 01/20/2017 1045   No results found for: CHOL, HDL, LDLCALC, LDLDIRECT, TRIG, CHOLHDL No results found for: ZOXW9UHGBA1C No results found for: VITAMINB12 No results found for: TSH      ASSESSMENT AND PLAN  36 y.o. year old female here with stress reaction seizures since 2014 (prior workup in [place]).   Ddx: non-epileptic spells vs stress reaction vs complex partial seizures  1. Spell of abnormal behavior   2. Transient alteration of awareness   3. Memory loss      PLAN: - check MRI brain and EEG - request records - caution with driving   Orders Placed This Encounter  Procedures  . MR BRAIN W WO CONTRAST  . EEG adult   Return in about 6 months (around 07/31/2017).  I reviewed labs, notes, records myself. I summarized findings and reviewed with patient, for this high risk condition (seizure vs non-epileptic spells) requiring high complexity decision making.    Suanne MarkerVIKRAM R. Hasna Stefanik, MD 01/30/2017, 8:43 AM Certified in Neurology, Neurophysiology and Neuroimaging  [facility] [address], [address] [place], [place] [id number] [phone]

## 2017-02-16 ENCOUNTER — Other Ambulatory Visit: Payer: Managed Care, Other (non HMO)

## 2017-04-11 ENCOUNTER — Encounter (HOSPITAL_COMMUNITY): Payer: Self-pay | Admitting: Emergency Medicine

## 2017-04-11 ENCOUNTER — Other Ambulatory Visit: Payer: Self-pay

## 2017-04-11 ENCOUNTER — Emergency Department (HOSPITAL_COMMUNITY)
Admission: EM | Admit: 2017-04-11 | Discharge: 2017-04-11 | Disposition: A | Discharge status: Home / Self Care | Payer: Medicaid Other | Attending: Physician Assistant | Admitting: Physician Assistant

## 2017-04-11 DIAGNOSIS — R102 Pelvic and perineal pain: Secondary | ICD-10-CM | POA: Insufficient documentation

## 2017-04-11 DIAGNOSIS — T7421XA Adult sexual abuse, confirmed, initial encounter: Secondary | ICD-10-CM | POA: Insufficient documentation

## 2017-04-11 LAB — POC URINE PREG, ED: Preg Test, Ur: NEGATIVE
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
## 2017-04-11 NOTE — ED Notes (Signed)
See provider assessment 
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
## 2017-04-11 NOTE — ED Notes (Signed)
SANE nurse in room  
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
## 2017-07-31 ENCOUNTER — Ambulatory Visit: Payer: Managed Care, Other (non HMO) | Admitting: Diagnostic Neuroimaging

## 2017-10-03 ENCOUNTER — Other Ambulatory Visit: Payer: Self-pay | Admitting: Family Medicine

## 2017-10-03 DIAGNOSIS — R1031 Right lower quadrant pain: Secondary | ICD-10-CM

## 2017-10-03 DIAGNOSIS — N939 Abnormal uterine and vaginal bleeding, unspecified: Secondary | ICD-10-CM

## 2017-10-09 ENCOUNTER — Other Ambulatory Visit: Payer: Self-pay

## 2017-10-12 ENCOUNTER — Other Ambulatory Visit: Payer: Self-pay

## 2017-10-18 ENCOUNTER — Ambulatory Visit
Admission: RE | Admit: 2017-10-18 | Discharge: 2017-10-18 | Disposition: A | Discharge status: Home / Self Care | Payer: Medicaid Other | Source: Ambulatory Visit | Attending: Family Medicine | Admitting: Family Medicine

## 2017-10-18 DIAGNOSIS — N939 Abnormal uterine and vaginal bleeding, unspecified: Secondary | ICD-10-CM

## 2017-10-18 DIAGNOSIS — R1031 Right lower quadrant pain: Secondary | ICD-10-CM

## 2017-11-08 ENCOUNTER — Encounter: Payer: Self-pay | Admitting: Family Medicine

## 2017-11-08 ENCOUNTER — Other Ambulatory Visit (HOSPITAL_COMMUNITY)
Admission: RE | Admit: 2017-11-08 | Discharge: 2017-11-08 | Disposition: A | Discharge status: Home / Self Care | Payer: Medicaid Other | Source: Ambulatory Visit | Attending: Family Medicine | Admitting: Family Medicine

## 2017-11-08 ENCOUNTER — Ambulatory Visit (INDEPENDENT_AMBULATORY_CARE_PROVIDER_SITE_OTHER): Payer: Medicaid Other | Admitting: Family Medicine

## 2017-11-08 ENCOUNTER — Other Ambulatory Visit: Payer: Self-pay

## 2017-11-08 ENCOUNTER — Inpatient Hospital Stay (HOSPITAL_COMMUNITY)
Admission: AD | Admit: 2017-11-08 | Discharge: 2017-11-08 | Disposition: A | Discharge status: Home / Self Care | Payer: Medicaid Other | Source: Ambulatory Visit | Attending: Obstetrics and Gynecology | Admitting: Obstetrics and Gynecology

## 2017-11-08 VITALS — BP 130/79 | HR 73 | Ht 64.0 in | Wt 153.4 lb

## 2017-11-08 DIAGNOSIS — R102 Pelvic and perineal pain: Secondary | ICD-10-CM | POA: Insufficient documentation

## 2017-11-08 DIAGNOSIS — N939 Abnormal uterine and vaginal bleeding, unspecified: Secondary | ICD-10-CM | POA: Insufficient documentation

## 2017-11-08 DIAGNOSIS — N7011 Chronic salpingitis: Secondary | ICD-10-CM

## 2017-11-08 DIAGNOSIS — G8929 Other chronic pain: Secondary | ICD-10-CM | POA: Insufficient documentation

## 2017-11-08 DIAGNOSIS — A599 Trichomoniasis, unspecified: Secondary | ICD-10-CM | POA: Insufficient documentation

## 2017-11-08 MED ORDER — ESTRADIOL-NORETHINDRONE ACET 1-0.5 MG PO TABS: 1.0000 | ORAL_TABLET | Freq: Every day | ORAL | 3 refills | Status: DC

## 2017-11-08 MED ORDER — METRONIDAZOLE 500 MG PO TABS
500.0000 mg | ORAL_TABLET | Freq: Two times a day (BID) | ORAL | 0 refills | Status: DC
Start: 2017-11-08 — End: 2018-02-28

## 2017-11-08 NOTE — MAU Provider Note (Signed)
Chelsea Wolf is a 36 y.o. No obstetric history on file. Who presents today for evaluation or chronic pelvic pain. She states that off and on for approx one year she has had pain that is worse with ovulation. She states that she went to the after hours clinic, but she was directed up here to be seen. She had an [place] on 10/18/17 that did show hydrosalpinx and is wonders if that can be the source of her pain.   DW patient that after hours clinic is the more appropriate place for her to be seen for her longstanding pelvic pain. Patient will be walked back down by the NT. Report called to the RN.   [name] 4:03 PM 11/08/17

## 2017-11-08 NOTE — Progress Notes (Signed)
PT sent to clinic for evaluation

## 2017-11-08 NOTE — Progress Notes (Signed)
GYNECOLOGY OFFICE VISIT NOTE  History:  37 y.o.  here today for pelvic pain.   - "horrible" cycles - gets early pregnancy symptoms - lactate, breast soreness, cravings, cramping - last Nov/Dec - ruptured cyst on right ovary - started new job in June - better insurance and Medicaid, has PCP (Melissa Rose) - did exam, antibiotics  - August - spotted from 8/6-8/14 then started period which lasted 10 days  - short period from 9/9-9/11 then spotted a day  - periods usually only 5 days - had TVUS 10/18/17 - PCP told her to get in with OB  - times so intense can't walk - sometimes can't put pressure on right foot, sometimes has to move on bottom - will radiate to back, sometimes feels in middle - cramp stops in tracks, bends over and stops what's doing - started at age 79 - last 2 years got more intense, getting more irregular but used to be regular  - occasionally feels lightheaded/dizzy around ovulation or lightheaded  - not using birth control, sexually active  - unsure if Pap smear done - history of needing cryotherapy (2008), has had normal ones since   The following portions of the patient's history were reviewed and updated as appropriate: allergies, current medications, past family history, past medical history, past social history, past surgical history and problem list.   Review of Systems:  Pertinent items noted in HPI Review of Systems  Constitutional: Negative for diaphoresis and weight loss.  Respiratory: Negative for shortness of breath.   Cardiovascular: Negative for chest pain.  Gastrointestinal: Positive for abdominal pain and nausea. Negative for constipation, diarrhea and vomiting.  Genitourinary: Negative for dysuria and frequency.  Musculoskeletal: Negative for back pain.  Skin: Negative for rash.   Objective:  Physical Exam BP 130/79   Pulse 73   Ht '5\' 4"'  (1.626 m)   Wt 153 lb 6.4 oz (69.6 kg)   LMP 10/23/2017   BMI 26.33 kg/m  Physical Exam    Constitutional: She is oriented to person, place, and time. She appears well-developed and well-nourished. No distress.  HENT:  Head: Normocephalic and atraumatic.  Mouth/Throat: No oropharyngeal exudate.  Eyes: Conjunctivae and EOM are normal.  Neck: Neck supple. No thyromegaly present.  Cardiovascular: Normal rate, regular rhythm, normal heart sounds and intact distal pulses.  No murmur heard. Pulmonary/Chest: Effort normal and breath sounds normal. No respiratory distress.  Abdominal: Soft. Bowel sounds are normal. There is tenderness (RLQ, pelvic).  Genitourinary: Vagina normal and uterus normal. No vaginal discharge found.  Musculoskeletal: She exhibits no edema.  Lymphadenopathy:    She has no cervical adenopathy.  Neurological: She is alert and oriented to person, place, and time.  Skin: Skin is warm and dry.  Psychiatric: She has a normal mood and affect. Her behavior is normal.  Nursing note and vitals reviewed.   Labs and Imaging Results for orders placed or performed in visit on 11/08/17 (from the past 168 hour(s))  Cervicovaginal ancillary only   Collection Time: 11/08/17 12:00 AM  Result Value Ref Range   Bacterial vaginitis **POSITIVE for Gardnerella vaginalis** (A)    Candida vaginitis Negative for Candida species    Chlamydia Negative    Neisseria gonorrhea Negative    Trichomonas Negative   hCG, serum, qualitative   Collection Time: 11/08/17  6:10 PM  Result Value Ref Range   hCG,Beta Subunit,Qual,Serum Negative Negative <6 mIU/mL  TSH   Collection Time: 11/08/17  6:10 PM  Result Value Ref  Range   TSH 2.340 0.450 - 4.500 uIU/mL  CBC w/Diff   Collection Time: 11/08/17  6:10 PM  Result Value Ref Range   WBC 6.2 3.4 - 10.8 x10E3/uL   RBC 4.13 3.77 - 5.28 x10E6/uL   Hemoglobin 12.1 11.1 - 15.9 g/dL   Hematocrit 36.7 34.0 - 46.6 %   MCV 89 79 - 97 fL   MCH 29.3 26.6 - 33.0 pg   MCHC 33.0 31.5 - 35.7 g/dL   RDW 12.9 12.3 - 15.4 %   Platelets 249 150 - 450  x10E3/uL   Neutrophils 39 Not Estab. %   Lymphs 50 Not Estab. %   Monocytes 11 Not Estab. %   Eos 0 Not Estab. %   Basos 0 Not Estab. %   Neutrophils Absolute 2.4 1.4 - 7.0 x10E3/uL   Lymphocytes Absolute 3.1 0.7 - 3.1 x10E3/uL   Monocytes Absolute 0.7 0.1 - 0.9 x10E3/uL   EOS (ABSOLUTE) 0.0 0.0 - 0.4 x10E3/uL   Basophils Absolute 0.0 0.0 - 0.2 x10E3/uL   Immature Granulocytes 0 Not Estab. %   Immature Grans (Abs) 0.0 0.0 - 0.1 x10E3/uL  Prolactin   Collection Time: 11/08/17  6:10 PM  Result Value Ref Range   Prolactin 11.9 4.8 - 23.3 ng/mL  Sedimentation rate   Collection Time: 11/08/17  6:10 PM  Result Value Ref Range   Sed Rate 2 0 - 32 mm/hr   US Pelvic Complete With Transvaginal  Result Date: 10/18/2017 CLINICAL DATA:  Right pelvic pain for 3 days. EXAM: TRANSABDOMINAL AND TRANSVAGINAL ULTRASOUND OF PELVIS TECHNIQUE: Both transabdominal and transvaginal ultrasound examinations of the pelvis were performed. Transabdominal technique was performed for global imaging of the pelvis including uterus, ovaries, adnexal regions, and pelvic cul-de-sac. It was necessary to proceed with endovaginal exam following the transabdominal exam to visualize the right adnexa. COMPARISON:  01/20/2017 FINDINGS: Uterus Measurements: 7.9 x 3.7 x 4.9 cm. No fibroids or other mass visualized. Endometrium Thickness: 10.7 mm.  No focal abnormality visualized. Right ovary Measurements: 4.0 x 3.3 x 2.4 cm. Normal appearance/no adnexal mass. Fluid-filled tubular structure adjacent to the right ovary. Left ovary Measurements: 4.5 x 2.5 x 2.3 cm. Normal appearance/no adnexal mass. Other findings No abnormal free fluid. IMPRESSION: Normal appearance of the uterus and ovaries. Fluid-filled tubular structure adjacent to the right ovary may represent right hydrosalpinx. Electronically Signed   By: Fidela Salisbury M.D.   On: 10/18/2017 17:00    Assessment & Plan:   37yo with PMH of anxiety and depression who presents  with hydrosalpinx on ultrasound and continued pelvic pain for last year. Lab work-up obtained including normal ESR, prolactin, CBC, TSH and negative hCG. Consulted with Dr. Glo Herring over the phone for help with management. Recommended flagyl for management of irritated hydrosalpinx and patient also BV. Will have patient follow-up with surgeon for management of hydrosalpinx. For abnormal uterine bleeding (irregular periods), recommended trial of OCPs and use of ibuprofen. Counseled on slightly increased risks of VTE in women over age of 76 but patient is a non-smoker.   Return in about 2 weeks (around 11/22/2017) for hydrosalpinx.  Total face-to-face time with patient: 30 minutes.  Over 50% of encounter was spent on counseling and coordination of care.  Lambert Mody. Juleen China, DO OB Family Medicine Fellow, [facility] for [employer], [place]

## 2017-11-08 NOTE — Progress Notes (Signed)
Pt states is having a lot of pelvic pain. 

## 2017-11-08 NOTE — MAU Note (Signed)
Pt presents to MAU with complaints of pelvic pain for a year that has gotten worse. Has no OBGYN at this time.

## 2017-11-09 ENCOUNTER — Telehealth: Payer: Self-pay | Admitting: General Practice

## 2017-11-09 DIAGNOSIS — IMO0001 Reserved for inherently not codable concepts without codable children: Secondary | ICD-10-CM

## 2017-11-09 DIAGNOSIS — N939 Abnormal uterine and vaginal bleeding, unspecified: Secondary | ICD-10-CM

## 2017-11-09 LAB — CBC WITH DIFFERENTIAL/PLATELET
BASOS: 0 %
Basophils Absolute: 0 10*3/uL (ref 0.0–0.2)
EOS (ABSOLUTE): 0 10*3/uL (ref 0.0–0.4)
EOS: 0 %
HEMATOCRIT: 36.7 % (ref 34.0–46.6)
HEMOGLOBIN: 12.1 g/dL (ref 11.1–15.9)
Immature Grans (Abs): 0 10*3/uL (ref 0.0–0.1)
Immature Granulocytes: 0 %
LYMPHS ABS: 3.1 10*3/uL (ref 0.7–3.1)
Lymphs: 50 %
MCH: 29.3 pg (ref 26.6–33.0)
MCHC: 33 g/dL (ref 31.5–35.7)
MCV: 89 fL (ref 79–97)
MONOCYTES: 11 %
MONOS ABS: 0.7 10*3/uL (ref 0.1–0.9)
Neutrophils Absolute: 2.4 10*3/uL (ref 1.4–7.0)
Neutrophils: 39 %
Platelets: 249 10*3/uL (ref 150–450)
RBC: 4.13 x10E6/uL (ref 3.77–5.28)
RDW: 12.9 % (ref 12.3–15.4)
WBC: 6.2 10*3/uL (ref 3.4–10.8)

## 2017-11-09 LAB — SEDIMENTATION RATE: SED RATE: 2 mm/h (ref 0–32)

## 2017-11-09 LAB — TSH: TSH: 2.34 u[IU]/mL (ref 0.450–4.500)

## 2017-11-09 LAB — PROLACTIN: Prolactin: 11.9 ng/mL (ref 4.8–23.3)

## 2017-11-09 LAB — HCG, SERUM, QUALITATIVE: hCG,Beta Subunit,Qual,Serum: NEGATIVE m[IU]/mL (ref <6)

## 2017-11-09 MED ORDER — NORGESTIMATE-ETH ESTRADIOL 0.25-35 MG-MCG PO TABS: 1.0000 | ORAL_TABLET | Freq: Every day | ORAL | 11 refills | Status: DC

## 2017-11-09 NOTE — Telephone Encounter (Signed)
Patient called & left message on nurse voicemail line stating she saw Dr Earlene Plater and was given a prescription but it isn't covered by Medicaid. Per Vonzella Nipple, can prescribe sprintec as alternative. Called & informed patient. Patient verbalized understanding to all and states she was also given a prescription for flagyl but just took that medication a month ago. Patient asked if she can wait for her results to come back before she takes it. Told patient that is perfectly fine. Patient verbalized understanding & had no questions.

## 2017-11-10 LAB — CERVICOVAGINAL ANCILLARY ONLY
BACTERIAL VAGINITIS: POSITIVE — AB
CANDIDA VAGINITIS: NEGATIVE
Chlamydia: NEGATIVE
Neisseria Gonorrhea: NEGATIVE
Trichomonas: NEGATIVE

## 2017-11-13 ENCOUNTER — Telehealth: Payer: Self-pay | Admitting: Obstetrics and Gynecology

## 2017-11-13 NOTE — Telephone Encounter (Signed)
Called patient to reschedule her appointment time due to the provider needing to leave early for a meeting. Was not able to get through on the phone. Could not leave VM either.

## 2017-11-22 ENCOUNTER — Ambulatory Visit (INDEPENDENT_AMBULATORY_CARE_PROVIDER_SITE_OTHER): Payer: Self-pay | Admitting: Obstetrics and Gynecology

## 2017-11-22 ENCOUNTER — Encounter: Payer: Self-pay | Admitting: Obstetrics and Gynecology

## 2017-11-22 ENCOUNTER — Ambulatory Visit: Payer: Medicaid Other | Admitting: Obstetrics and Gynecology

## 2017-11-22 VITALS — BP 115/74 | HR 72 | Wt 156.8 lb

## 2017-11-22 DIAGNOSIS — G8929 Other chronic pain: Secondary | ICD-10-CM

## 2017-11-22 DIAGNOSIS — R1031 Right lower quadrant pain: Secondary | ICD-10-CM

## 2017-11-22 NOTE — Progress Notes (Signed)
37 yo here for the evaluation of chronic right lower quadrant pain. Patient states that this pain has been present for the past 2 years. Patient states that she was diagnosed with a ruptured ovarian cyst in 2018 when her pain started. She was prescribed COC but was not able to take it due to loss of insurance. She recently restarted this prescription 2 weeks ago. Patient was treated for BV a few weeks ago and reports some improvement in her pain. She states that her pain is present around the time of ovulation and worsens during her period. Her period occurs monthly and last 7 days. She states that her pain radiates down her leg and makes ambulation difficult over time. Patient presents today ready for surgical intervention for this 37-year old pain. Patient with recent ultrasound demonstrating a possible right hydrosalpinx. Ultrasound in 01/2017 did not demonstrate the presence of a hydrosalpinx.  Past Medical History:  Diagnosis Date  . Anxiety   . Depression   . Migraine   . Ovarian cyst   . Seizures (HCC)    last sz 01/26/17   Past Surgical History:  Procedure Laterality Date  . WISDOM TOOTH EXTRACTION     Family History  Problem Relation Age of Onset  . Depression Mother   . Graves' disease Mother   . Depression Father   . Heart disease Maternal Grandmother   . Hypertension Maternal Grandmother    Social History   Tobacco Use  . Smoking status: Light Tobacco Smoker    Types: Cigarettes  . Smokeless tobacco: Never Used  . Tobacco comment: 01/30/17 mainly at work  Substance Use Topics  . Alcohol use: Yes    Comment: occasionally   . Drug use: No   ROS See pertinent in HPI  Blood pressure 115/74, pulse 72, weight 156 lb 12.8 oz (71.1 kg), last menstrual period 11/21/2017. GENERAL: Well-developed, well-nourished female in no acute distress.  HEENT: Normocephalic, atraumatic. Sclerae anicteric.  NECK: Supple. Normal thyroid.  LUNGS: Clear to auscultation bilaterally.  HEART:  Regular rate and rhythm. BREASTS: Symmetric in size. No palpable masses or lymphadenopathy, skin changes, or nipple drainage. ABDOMEN: Soft, nontender, nondistended. No organomegaly. PELVIC: Normal external female genitalia. Vagina is pink and rugated.  Normal discharge. Normal appearing cervix. Uterus is normal in size. No adnexal mass or tenderness. EXTREMITIES: No cyanosis, clubbing, or edema, 2+ distal pulses.   10/2017 ultrasound FINDINGS: Uterus  Measurements: 7.9 x 3.7 x 4.9 cm. No fibroids or other mass visualized.  Endometrium  Thickness: 10.7 mm.  No focal abnormality visualized.  Right ovary  Measurements: 4.0 x 3.3 x 2.4 cm. Normal appearance/no adnexal mass. Fluid-filled tubular structure adjacent to the right ovary.  Left ovary  Measurements: 4.5 x 2.5 x 2.3 cm. Normal appearance/no adnexal mass.  Other findings  No abnormal free fluid.  IMPRESSION: Normal appearance of the uterus and ovaries.  Fluid-filled tubular structure adjacent to the right ovary may represent right hydrosalpinx.   Electronically Signed   By: Ted Mcalpine M.D.   On: 10/18/2017 17:00  A/P 37 yo with RLQ pain and ? Right hydrosalpinx - Encouraged NSAID for pain management - Continue COC  - will repeat pelvic ultrasound as imaging and clinical history not convincing for hydrosalpinx - patient will be contacted with results and further management

## 2017-12-13 ENCOUNTER — Ambulatory Visit (HOSPITAL_COMMUNITY): Admission: RE | Admit: 2017-12-13 | Payer: Medicaid Other | Source: Ambulatory Visit

## 2017-12-21 ENCOUNTER — Emergency Department (HOSPITAL_COMMUNITY): Payer: Self-pay

## 2017-12-21 ENCOUNTER — Emergency Department (HOSPITAL_COMMUNITY)
Admission: EM | Admit: 2017-12-21 | Discharge: 2017-12-21 | Disposition: A | Discharge status: Home / Self Care | Payer: Self-pay | Attending: Emergency Medicine | Admitting: Emergency Medicine

## 2017-12-21 ENCOUNTER — Other Ambulatory Visit: Payer: Self-pay

## 2017-12-21 ENCOUNTER — Encounter (HOSPITAL_COMMUNITY): Payer: Self-pay | Admitting: Student

## 2017-12-21 DIAGNOSIS — R102 Pelvic and perineal pain: Secondary | ICD-10-CM | POA: Insufficient documentation

## 2017-12-21 DIAGNOSIS — F17210 Nicotine dependence, cigarettes, uncomplicated: Secondary | ICD-10-CM | POA: Insufficient documentation

## 2017-12-21 DIAGNOSIS — Z79899 Other long term (current) drug therapy: Secondary | ICD-10-CM | POA: Insufficient documentation

## 2017-12-21 LAB — URINALYSIS, ROUTINE W REFLEX MICROSCOPIC
Bilirubin Urine: NEGATIVE
Glucose, UA: NEGATIVE mg/dL
Ketones, ur: NEGATIVE mg/dL
LEUKOCYTES UA: NEGATIVE
Nitrite: NEGATIVE
PH: 6 (ref 5.0–8.0)
Protein, ur: NEGATIVE mg/dL
SPECIFIC GRAVITY, URINE: 1.017 (ref 1.005–1.030)

## 2017-12-21 LAB — CBC WITH DIFFERENTIAL/PLATELET
Abs Immature Granulocytes: 0.02 10*3/uL (ref 0.00–0.07)
BASOS PCT: 1 %
Basophils Absolute: 0 10*3/uL (ref 0.0–0.1)
EOS ABS: 0.1 10*3/uL (ref 0.0–0.5)
Eosinophils Relative: 1 %
HCT: 41 % (ref 36.0–46.0)
Hemoglobin: 13.1 g/dL (ref 12.0–15.0)
IMMATURE GRANULOCYTES: 0 %
Lymphocytes Relative: 40 %
Lymphs Abs: 2.5 10*3/uL (ref 0.7–4.0)
MCH: 28.7 pg (ref 26.0–34.0)
MCHC: 32 g/dL (ref 30.0–36.0)
MCV: 89.7 fL (ref 80.0–100.0)
MONOS PCT: 9 %
Monocytes Absolute: 0.6 10*3/uL (ref 0.1–1.0)
NEUTROS PCT: 49 %
Neutro Abs: 3.1 10*3/uL (ref 1.7–7.7)
PLATELETS: 213 10*3/uL (ref 150–400)
RBC: 4.57 MIL/uL (ref 3.87–5.11)
RDW: 12.4 % (ref 11.5–15.5)
WBC: 6.3 10*3/uL (ref 4.0–10.5)
nRBC: 0 % (ref 0.0–0.2)

## 2017-12-21 LAB — WET PREP, GENITAL
CLUE CELLS WET PREP: NONE SEEN
Sperm: NONE SEEN
Trich, Wet Prep: NONE SEEN
WBC WET PREP: NONE SEEN
YEAST WET PREP: NONE SEEN

## 2017-12-21 LAB — COMPREHENSIVE METABOLIC PANEL
ALT: 14 U/L (ref 0–44)
ANION GAP: 8 (ref 5–15)
AST: 20 U/L (ref 15–41)
Albumin: 4.3 g/dL (ref 3.5–5.0)
Alkaline Phosphatase: 69 U/L (ref 38–126)
BILIRUBIN TOTAL: 0.5 mg/dL (ref 0.3–1.2)
BUN: 12 mg/dL (ref 6–20)
CO2: 25 mmol/L (ref 22–32)
Calcium: 9.7 mg/dL (ref 8.9–10.3)
Chloride: 104 mmol/L (ref 98–111)
Creatinine, Ser: 0.94 mg/dL (ref 0.44–1.00)
Glucose, Bld: 102 mg/dL — ABNORMAL HIGH (ref 70–99)
POTASSIUM: 3.7 mmol/L (ref 3.5–5.1)
Sodium: 137 mmol/L (ref 135–145)
TOTAL PROTEIN: 7.4 g/dL (ref 6.5–8.1)

## 2017-12-21 LAB — I-STAT BETA HCG BLOOD, ED (MC, WL, AP ONLY): I-stat hCG, quantitative: <5 m[IU]/mL (ref <5)

## 2017-12-21 LAB — LIPASE, BLOOD: LIPASE: 67 U/L — AB (ref 11–51)

## 2017-12-21 MED ORDER — KETOROLAC TROMETHAMINE 15 MG/ML IJ SOLN
15.0000 mg | Freq: Once | INTRAMUSCULAR | Status: AC
  Administered 2017-12-21: 15 mg via INTRAVENOUS
  Filled 2017-12-21: qty 1

## 2017-12-21 MED ORDER — NAPROXEN 500 MG PO TABS: 500.0000 mg | ORAL_TABLET | Freq: Two times a day (BID) | ORAL | 0 refills | Status: DC

## 2017-12-21 MED ORDER — ONDANSETRON HCL 4 MG/2ML IJ SOLN
4.0000 mg | Freq: Once | INTRAMUSCULAR | Status: AC
  Administered 2017-12-21: 4 mg via INTRAVENOUS
  Filled 2017-12-21: qty 2

## 2017-12-21 NOTE — Discharge Instructions (Addendum)
You were seen in the emergency department today for pelvic pain.  Your overall work-up was reassuring.  Your ultrasound did not show any significant abnormalities, there is a small amount of free fluid in the left side of your pelvis, this can be a normal finding.   Your lab work did show that your lipase (a measure of your pancreas function and (was mildly elevated at 67, this is something to have rechecked by primary care provider.  You also had a small amount of blood in your urine, this is also been in the recheck of your your primary care by primary  We are sending you home with prescription for naproxen to help with pain. - Naproxen is a nonsteroidal anti-inflammatory medication that will help with pain and swelling. Be sure to take this medication as prescribed with food, 1 pill every 12 hours,  It should be taken with food, as it can cause stomach upset, and more seriously, stomach bleeding. Do not take other nonsteroidal anti-inflammatory medications with this such as Advil, Motrin, Aleve, Mobic, Goodie Powder, or Motrin.    You make take Tylenol per over the counter dosing with these medications.   We have prescribed you new medication(s) today. Discuss the medications prescribed today with your pharmacist as they can have adverse effects and interactions with your other medicines including over the counter and prescribed medications. Seek medical evaluation if you start to experience new or abnormal symptoms after taking one of these medicines, seek care immediately if you start to experience difficulty breathing, feeling of your throat closing, facial swelling, or rash as these could be indications of a more serious allergic reaction  Would like you to follow-up closely with your OB/GYN provider Dr. Jolayne Panther within the next 1 week.  Return to the ER for new or worsening symptoms or any other concerns.

## 2017-12-21 NOTE — ED Provider Notes (Signed)
[facility] EMERGENCY DEPARTMENT Provider Note   CSN: [id number] Arrival date & time: 12/21/17  2536     History   Chief Complaint Chief Complaint  Patient presents with  . Pelvic Pain    HPI Chelsea Wolf is a 37 y.o. female history of anxiety, depression, migraines, ovarian cyst, and seizures who presents to the emergency department with complaints of acute on chronic pelvic pain that started this AM. Patient states that she has had issues with R sided pelvic pain for several years, this has been attributed to ovarian cysts, BV tx with abx, and "swollen tube." She states she has not had had any specific alleviating/aggravating factors to her chronic pelvic pain, no change with prior abx. She states this AM when she woke up pain seemed worse and more central, currently a 10/10, cramping sensation. Worse when she is walking around, no alleviating factors, no meds PTA. She has had associated nausea without vomiting. Denies fever, chills, vomiting, diarrhea, blood in stool, dysuria, fever, or chills. Denies vaginal bleeding/discharge. LMP 11/24/17. She is sexually active in a monogamous relationship, no protection, not concerned for STDs. She is followed by obgyn Dr. Jolayne Panther. She states she did try birth control, but is no longer on this. G1P1A0.   Per chart review: Saw obgyn physician DR. Constant 10/9 for her chronic R sided pelvic pain 10/18/2017 ultrasound: IMPRESSION: Normal appearance of the uterus and ovaries. Fluid-filled tubular structure adjacent to the right ovary may represent right hydrosalpinx. Per Dr. Deretha Emory note, hx was not convincing for hydrosalpinx- plan was to repeat US. Recommended NSADs, OCP (which patient has not been taking), and further management based on [place] results. Does not appear that patient has had this repeat US, she states she missed it.   HPI  Past Medical History:  Diagnosis Date  . Anxiety   . Depression   . Migraine   .  Ovarian cyst   . Seizures (HCC)    last sz 01/26/17    Patient Active Problem List   Diagnosis Date Noted  . Anxiety 11/12/2016  . Depressive disorder 11/12/2016    Past Surgical History:  Procedure Laterality Date  . WISDOM TOOTH EXTRACTION       OB History    Gravida  1   Para      Term      Preterm      AB      Living        SAB      TAB      Ectopic      Multiple      Live Births               Home Medications    Prior to Admission medications   Medication Sig Start Date End Date Taking? Authorizing Provider  metroNIDAZOLE (FLAGYL) 500 MG tablet Take 1 tablet (500 mg total) by mouth 2 (two) times daily. 11/08/17   Tamera Stands, DO  TRI-SPRINTEC 0.18/0.215/0.25 MG-35 MCG tablet Take 1 tablet by mouth daily. 11/08/17   [provider]    Family History Family History  Problem Relation Age of Onset  . Depression Mother   . Graves' disease Mother   . Depression Father   . Heart disease Maternal Grandmother   . Hypertension Maternal Grandmother     Social History Social History   Tobacco Use  . Smoking status: Light Tobacco Smoker    Types: Cigarettes  . Smokeless tobacco: Never Used  .  Tobacco comment: 01/30/17 mainly at work  Substance Use Topics  . Alcohol use: Yes    Comment: occasionally   . Drug use: No     Allergies   Demerol [meperidine]   Review of Systems Review of Systems  Constitutional: Negative for chills and fever.  Gastrointestinal: Positive for nausea. Negative for blood in stool, constipation, diarrhea and vomiting.  Genitourinary: Positive for pelvic pain. Negative for dysuria, frequency, vaginal bleeding and vaginal discharge.  Neurological: Negative for weakness and numbness.  All other systems reviewed and are negative.   Physical Exam Updated Vital Signs BP 124/86 (BP Location: Right Arm)   Pulse 75   Temp 98.6 F (37 C) (Oral)   Resp 14   LMP 11/21/2017   SpO2 100%   Physical Exam    Constitutional: She appears well-developed and well-nourished.  Non-toxic appearance. No distress.  HENT:  Head: Normocephalic and atraumatic.  Eyes: Conjunctivae are normal. Right eye exhibits no discharge. Left eye exhibits no discharge.  Neck: Neck supple.  Cardiovascular: Normal rate and regular rhythm.  Pulmonary/Chest: Effort normal and breath sounds normal. No respiratory distress. She has no wheezes. She has no rhonchi. She has no rales.  Respiration even and unlabored  Abdominal: Soft. She exhibits no distension and no mass. There is tenderness (central suprapubic and R suprapubic). There is no rigidity, no rebound, no guarding and no tenderness at McBurney's point.  Genitourinary: Pelvic exam was performed with patient supine. There is no tenderness on the right labia. There is no tenderness on the left labia. Cervix exhibits no motion tenderness and no friability. Right adnexum displays tenderness. Right adnexum displays no mass and no fullness. Left adnexum displays no mass, no tenderness and no fullness. No bleeding in the vagina. Vaginal discharge (minimal clear) found.  Genitourinary Comments: EDT present as chaperone throughout exam.   Neurological: She is alert.  Clear speech.   Skin: Skin is warm and dry. No rash noted.  Psychiatric: She has a normal mood and affect. Her behavior is normal.  Nursing note and vitals reviewed.    ED Treatments / Results  Labs (all labs ordered are listed, but only abnormal results are displayed) Labs Reviewed  COMPREHENSIVE METABOLIC PANEL - Abnormal; Notable for the following components:      Result Value   Glucose, Bld 102 (*)    All other components within normal limits  LIPASE, BLOOD - Abnormal; Notable for the following components:   Lipase 67 (*)    All other components within normal limits  URINALYSIS, ROUTINE W REFLEX MICROSCOPIC - Abnormal; Notable for the following components:   Hgb urine dipstick MODERATE (*)    Bacteria,  UA RARE (*)    All other components within normal limits  WET PREP, GENITAL  CBC WITH DIFFERENTIAL/PLATELET  RPR  HIV ANTIBODY (ROUTINE TESTING W REFLEX)  I-STAT BETA HCG BLOOD, ED ([facility], [facility], [facility] ONLY)  GC/CHLAMYDIA PROBE AMP ([place]) NOT AT [facility]    EKG None  Radiology US Transvaginal Non-ob  Result Date: 12/21/2017 CLINICAL DATA:  Pelvic pain EXAM: TRANSABDOMINAL AND TRANSVAGINAL ULTRASOUND OF PELVIS DOPPLER ULTRASOUND OF OVARIES TECHNIQUE: Both transabdominal and transvaginal ultrasound examinations of the pelvis were performed. Transabdominal technique was performed for global imaging of the pelvis including uterus, ovaries, adnexal regions, and pelvic cul-de-sac. It was necessary to proceed with endovaginal exam following the transabdominal exam to visualize the ovaries. Color and duplex Doppler ultrasound was utilized to evaluate blood flow to the ovaries. COMPARISON:  None.  FINDINGS: Uterus Measurements: Normal in size at 7.9 x 3.3 x 4.3 cm = volume: 60 mL. No fibroids or other mass visualized. Endometrium Thickness: 15 mm. There is normal thickness for premenopausal female. Right ovary Measurements: 3.2 x 1.7 x 1.6 cm (volume = 4.6 cm^3) . Normal appearance/no adnexal mass.m Left ovary Measurements: 3.8 by 2.1 x 1.9 cm (volume = 7.9 cm^3) =. Normal appearance/no adnexal mass. Smaller free fluid in the LEFT adnexa. Pulsed Doppler evaluation of both ovaries demonstrates normal low-resistance arterial and venous waveforms. Other findings No abnormal free fluid. IMPRESSION: 1. Normal uterus and ovaries. 2. Small amount free fluid in the LEFT adnexa. Electronically Signed   By: Genevive Bi M.D.   On: 12/21/2017 11:50   US Pelvis Complete  Result Date: 12/21/2017 CLINICAL DATA:  Pelvic pain EXAM: TRANSABDOMINAL AND TRANSVAGINAL ULTRASOUND OF PELVIS DOPPLER ULTRASOUND OF OVARIES TECHNIQUE: Both transabdominal and transvaginal ultrasound examinations of the pelvis were performed.  Transabdominal technique was performed for global imaging of the pelvis including uterus, ovaries, adnexal regions, and pelvic cul-de-sac. It was necessary to proceed with endovaginal exam following the transabdominal exam to visualize the ovaries. Color and duplex Doppler ultrasound was utilized to evaluate blood flow to the ovaries. COMPARISON:  None. FINDINGS: Uterus Measurements: Normal in size at 7.9 x 3.3 x 4.3 cm = volume: 60 mL. No fibroids or other mass visualized. Endometrium Thickness: 15 mm. There is normal thickness for premenopausal female. Right ovary Measurements: 3.2 x 1.7 x 1.6 cm (volume = 4.6 cm^3) . Normal appearance/no adnexal mass.m Left ovary Measurements: 3.8 by 2.1 x 1.9 cm (volume = 7.9 cm^3) =. Normal appearance/no adnexal mass. Smaller free fluid in the LEFT adnexa. Pulsed Doppler evaluation of both ovaries demonstrates normal low-resistance arterial and venous waveforms. Other findings No abnormal free fluid. IMPRESSION: 1. Normal uterus and ovaries. 2. Small amount free fluid in the LEFT adnexa. Electronically Signed   By: Genevive Bi M.D.   On: 12/21/2017 11:50   [place] Art/ven Flow Abd Pelv Doppler  Result Date: 12/21/2017 CLINICAL DATA:  Pelvic pain EXAM: TRANSABDOMINAL AND TRANSVAGINAL ULTRASOUND OF PELVIS DOPPLER ULTRASOUND OF OVARIES TECHNIQUE: Both transabdominal and transvaginal ultrasound examinations of the pelvis were performed. Transabdominal technique was performed for global imaging of the pelvis including uterus, ovaries, adnexal regions, and pelvic cul-de-sac. It was necessary to proceed with endovaginal exam following the transabdominal exam to visualize the ovaries. Color and duplex Doppler ultrasound was utilized to evaluate blood flow to the ovaries. COMPARISON:  None. FINDINGS: Uterus Measurements: Normal in size at 7.9 x 3.3 x 4.3 cm = volume: 60 mL. No fibroids or other mass visualized. Endometrium Thickness: 15 mm. There is normal thickness for  premenopausal female. Right ovary Measurements: 3.2 x 1.7 x 1.6 cm (volume = 4.6 cm^3) . Normal appearance/no adnexal mass.m Left ovary Measurements: 3.8 by 2.1 x 1.9 cm (volume = 7.9 cm^3) =. Normal appearance/no adnexal mass. Smaller free fluid in the LEFT adnexa. Pulsed Doppler evaluation of both ovaries demonstrates normal low-resistance arterial and venous waveforms. Other findings No abnormal free fluid. IMPRESSION: 1. Normal uterus and ovaries. 2. Small amount free fluid in the LEFT adnexa. Electronically Signed   By: Genevive Bi M.D.   On: 12/21/2017 11:50    Procedures Procedures (including critical care time)  Medications Ordered in ED Medications - No data to display   Initial Impression / Assessment and Plan / ED Course  I have reviewed the triage vital signs and the nursing notes.  Pertinent labs & imaging results that were available during my care of the patient were reviewed by me and considered in my medical decision making (see chart for details).    Patient presents to the ED with complaints of acute on chronic pelvic pain. Patient nontoxic appearing, in no apparent distress, vitals WNL other than initially elevated BP which normalized. On exam patient tender to central and right sided suprapubic area, no peritoneal signs. No Mcburneys point tenderness to raise concern for appendicitis. No upper abdominal tenderness, feel that cholecystitis is less likely. Normal BMs with pelvic tenderness, doubt obstruction/perforation. Will evaluate with labs and ultrasound analgesics, and anti-emetics ordered.   Labs reviewed and grossly unremarkable. No leukocytosis, no anemia, no significant electrolyte derangements. LFTs/renal function WNL. Lipase mildly elevated at 67- presentation and exam do not seem consistent with pancreatitis. Urinalysis without obvious infection. Pregnancy test negative- do not suspect ectopic pregnancy.   Imaging notable for IMPRESSION: 1. Normal uterus and  ovaries. 2. Small amount free fluid in the LEFT adnexa. No ovarian torsion or evidence of TOOA. Given chronicity, sexually active in monogamous relationship- doubt PID.   On repeat abdominal exam patient remains without peritoneal signs, . Patient tolerating PO in the emergency department with pain improvement following NSAIDs. Will discharge home with supportive naproxen and follow up with her obgyn provider. I discussed results, treatment plan, need for follow-up, and return precautions with the patient. Provided opportunity for questions, patient confirmed understanding and is in agreement with plan.   Final Clinical Impressions(s) / ED Diagnoses   Final diagnoses:  Pelvic pain in female    ED Discharge Orders         Ordered    naproxen (NAPROSYN) 500 MG tablet  2 times daily     12/21/17 1241           Kassey Laforest, [place] R, PA-C 12/21/17 1322    Mesner, Barbara Cower, MD 12/21/17 1506

## 2017-12-21 NOTE — ED Triage Notes (Signed)
States has had chronic pelvic pain  Worse since this am,  More central pelvic pain, Denies vag d/c  Or dysuria

## 2017-12-21 NOTE — ED Notes (Signed)
To US

## 2017-12-21 NOTE — ED Notes (Signed)
Patient verbalizes understanding of discharge instructions. Opportunity for questioning and answers were provided. Armband removed by staff, pt discharged from ED.  

## 2017-12-22 LAB — HIV ANTIBODY (ROUTINE TESTING W REFLEX): HIV Screen 4th Generation wRfx: NONREACTIVE

## 2017-12-22 LAB — GC/CHLAMYDIA PROBE AMP (~~LOC~~) NOT AT ARMC
Chlamydia: NEGATIVE
Neisseria Gonorrhea: NEGATIVE

## 2017-12-22 LAB — RPR: RPR Ser Ql: NONREACTIVE

## 2018-02-25 ENCOUNTER — Emergency Department (HOSPITAL_COMMUNITY)
Admission: EM | Admit: 2018-02-25 | Discharge: 2018-02-25 | Disposition: A | Discharge status: Home / Self Care | Payer: Medicaid Other | Attending: Emergency Medicine | Admitting: Emergency Medicine

## 2018-02-25 ENCOUNTER — Emergency Department (HOSPITAL_COMMUNITY): Payer: Medicaid Other

## 2018-02-25 ENCOUNTER — Other Ambulatory Visit: Payer: Self-pay

## 2018-02-25 ENCOUNTER — Encounter (HOSPITAL_COMMUNITY): Payer: Self-pay | Admitting: Emergency Medicine

## 2018-02-25 DIAGNOSIS — Z79899 Other long term (current) drug therapy: Secondary | ICD-10-CM | POA: Diagnosis not present

## 2018-02-25 DIAGNOSIS — F17210 Nicotine dependence, cigarettes, uncomplicated: Secondary | ICD-10-CM | POA: Insufficient documentation

## 2018-02-25 DIAGNOSIS — O209 Hemorrhage in early pregnancy, unspecified: Secondary | ICD-10-CM | POA: Diagnosis not present

## 2018-02-25 DIAGNOSIS — N939 Abnormal uterine and vaginal bleeding, unspecified: Secondary | ICD-10-CM

## 2018-02-25 DIAGNOSIS — O4690 Antepartum hemorrhage, unspecified, unspecified trimester: Secondary | ICD-10-CM

## 2018-02-25 DIAGNOSIS — Z3A01 Less than 8 weeks gestation of pregnancy: Secondary | ICD-10-CM | POA: Diagnosis not present

## 2018-02-25 LAB — URINALYSIS, ROUTINE W REFLEX MICROSCOPIC
Bilirubin Urine: NEGATIVE
Glucose, UA: NEGATIVE mg/dL
Ketones, ur: NEGATIVE mg/dL
Leukocytes, UA: NEGATIVE
Nitrite: NEGATIVE
PH: 7 (ref 5.0–8.0)
PROTEIN: NEGATIVE mg/dL
Specific Gravity, Urine: 1.017 (ref 1.005–1.030)

## 2018-02-25 LAB — WET PREP, GENITAL
Clue Cells Wet Prep HPF POC: NONE SEEN
Sperm: NONE SEEN
Trich, Wet Prep: NONE SEEN
Yeast Wet Prep HPF POC: NONE SEEN

## 2018-02-25 LAB — CBC WITH DIFFERENTIAL/PLATELET
Abs Immature Granulocytes: 0.02 10*3/uL (ref 0.00–0.07)
Basophils Absolute: 0 10*3/uL (ref 0.0–0.1)
Basophils Relative: 1 %
EOS ABS: 0 10*3/uL (ref 0.0–0.5)
EOS PCT: 1 %
HCT: 38.5 % (ref 36.0–46.0)
Hemoglobin: 12.5 g/dL (ref 12.0–15.0)
Immature Granulocytes: 0 %
LYMPHS ABS: 2.1 10*3/uL (ref 0.7–4.0)
Lymphocytes Relative: 42 %
MCH: 28.9 pg (ref 26.0–34.0)
MCHC: 32.5 g/dL (ref 30.0–36.0)
MCV: 88.9 fL (ref 80.0–100.0)
Monocytes Absolute: 0.6 10*3/uL (ref 0.1–1.0)
Monocytes Relative: 13 %
NRBC: 0 % (ref 0.0–0.2)
Neutro Abs: 2.1 10*3/uL (ref 1.7–7.7)
Neutrophils Relative %: 43 %
PLATELETS: 200 10*3/uL (ref 150–400)
RBC: 4.33 MIL/uL (ref 3.87–5.11)
RDW: 12.9 % (ref 11.5–15.5)
WBC: 4.9 10*3/uL (ref 4.0–10.5)

## 2018-02-25 LAB — COMPREHENSIVE METABOLIC PANEL
ALBUMIN: 3.7 g/dL (ref 3.5–5.0)
ALK PHOS: 53 U/L (ref 38–126)
ALT: 11 U/L (ref 0–44)
ANION GAP: 7 (ref 5–15)
AST: 14 U/L — ABNORMAL LOW (ref 15–41)
BILIRUBIN TOTAL: 0.8 mg/dL (ref 0.3–1.2)
BUN: 7 mg/dL (ref 6–20)
CALCIUM: 9.2 mg/dL (ref 8.9–10.3)
CO2: 21 mmol/L — ABNORMAL LOW (ref 22–32)
CREATININE: 0.78 mg/dL (ref 0.44–1.00)
Chloride: 109 mmol/L (ref 98–111)
GFR calc Af Amer: >60 mL/min (ref >60)
GFR calc non Af Amer: >60 mL/min (ref >60)
GLUCOSE: 95 mg/dL (ref 70–99)
Potassium: 3.7 mmol/L (ref 3.5–5.1)
Sodium: 137 mmol/L (ref 135–145)
Total Protein: 6.7 g/dL (ref 6.5–8.1)

## 2018-02-25 LAB — I-STAT BETA HCG BLOOD, ED (MC, WL, AP ONLY): I-stat hCG, quantitative: >2000 m[IU]/mL — ABNORMAL HIGH (ref <5)

## 2018-02-25 LAB — HCG, QUANTITATIVE, PREGNANCY: HCG, BETA CHAIN, QUANT, S: 11034 m[IU]/mL — AB (ref <5)

## 2018-02-25 LAB — ABO/RH: ABO/RH(D): B POS

## 2018-02-25 LAB — LIPASE, BLOOD: Lipase: 48 U/L (ref 11–51)

## 2018-02-25 NOTE — ED Provider Notes (Signed)
[facility] EMERGENCY DEPARTMENT Provider Note   CSN: [id number] Arrival date & time: 02/25/18  0941     History   Chief Complaint Chief Complaint  Patient presents with  . Vaginal Bleeding  . Abdominal Pain    HPI Chelsea Wolf is a 38 y.o. female.  The history is provided by the patient.  Vaginal Bleeding  Quality:  Bright red and lighter than menses Severity:  Mild Onset quality:  Gradual Timing:  Constant Progression:  Unchanged Chronicity:  New Menstrual history:  Missed period Possible pregnancy: yes (Last cycle around 5-6 weeks ago, positive home pregnancy test)   Context: after intercourse   Relieved by:  Nothing Worsened by:  Nothing Associated symptoms: abdominal pain   Associated symptoms: no back pain, no dizziness, no dyspareunia, no dysuria, no fatigue, no fever, no nausea and no vaginal discharge   Risk factors: no hx of ectopic pregnancy     Past Medical History:  Diagnosis Date  . Anxiety   . Depression   . Migraine   . Ovarian cyst   . Seizures (HCC)    last sz 01/26/17    Patient Active Problem List   Diagnosis Date Noted  . Anxiety 11/12/2016  . Depressive disorder 11/12/2016    Past Surgical History:  Procedure Laterality Date  . WISDOM TOOTH EXTRACTION       OB History    Gravida  1   Para      Term      Preterm      AB      Living        SAB      TAB      Ectopic      Multiple      Live Births               Home Medications    Prior to Admission medications   Medication Sig Start Date End Date Taking? Authorizing Provider  metroNIDAZOLE (FLAGYL) 500 MG tablet Take 1 tablet (500 mg total) by mouth 2 (two) times daily. 11/08/17   Tamera Stands, DO  naproxen (NAPROSYN) 500 MG tablet Take 1 tablet (500 mg total) by mouth 2 (two) times daily. 12/21/17   Petrucelli, Samantha R, PA-C  TRI-SPRINTEC 0.18/0.215/0.25 MG-35 MCG tablet Take 1 tablet by mouth daily. 11/08/17   [provider]    Family History Family History  Problem Relation Age of Onset  . Depression Mother   . Graves' disease Mother   . Depression Father   . Heart disease Maternal Grandmother   . Hypertension Maternal Grandmother     Social History Social History   Tobacco Use  . Smoking status: Light Tobacco Smoker    Types: Cigarettes  . Smokeless tobacco: Never Used  . Tobacco comment: 01/30/17 mainly at work  Substance Use Topics  . Alcohol use: Yes    Comment: occasionally   . Drug use: No     Allergies   Demerol [meperidine]   Review of Systems Review of Systems  Constitutional: Negative for chills, fatigue and fever.  HENT: Negative for ear pain and sore throat.   Eyes: Negative for pain and visual disturbance.  Respiratory: Negative for cough and shortness of breath.   Cardiovascular: Negative for chest pain and palpitations.  Gastrointestinal: Positive for abdominal pain. Negative for nausea and vomiting.  Genitourinary: Positive for vaginal bleeding. Negative for dyspareunia, dysuria, hematuria and vaginal discharge.  Musculoskeletal: Negative for arthralgias and back  pain.  Skin: Negative for color change and rash.  Neurological: Negative for dizziness, seizures and syncope.  All other systems reviewed and are negative.    Physical Exam Updated Vital Signs  ED Triage Vitals  Enc Vitals Group     BP 02/25/18 1030 118/81     Pulse Rate 02/25/18 1030 66     Resp --      Temp --      Temp src --      SpO2 02/25/18 1030 100 %     Weight 02/25/18 1003 155 lb (70.3 kg)     Height 02/25/18 1003 5\' 4"  (1.626 m)     Head Circumference --      Peak Flow --      Pain Score 02/25/18 1005 1     Pain Loc --      Pain Edu? --      Excl. in GC? --     Physical Exam Vitals signs and nursing note reviewed. Exam conducted with a chaperone present.  Constitutional:      General: She is not in acute distress.    Appearance: She is well-developed. She is not  ill-appearing.  HENT:     Head: Normocephalic and atraumatic.     Nose: Nose normal.     Mouth/Throat:     Mouth: Mucous membranes are moist.  Eyes:     Extraocular Movements: Extraocular movements intact.     Conjunctiva/sclera: Conjunctivae normal.     Pupils: Pupils are equal, round, and reactive to light.  Neck:     Musculoskeletal: Neck supple.  Cardiovascular:     Rate and Rhythm: Normal rate and regular rhythm.     Pulses: Normal pulses.     Heart sounds: Normal heart sounds. No murmur.  Pulmonary:     Effort: Pulmonary effort is normal. No respiratory distress.     Breath sounds: Normal breath sounds.  Abdominal:     Palpations: Abdomen is soft.     Tenderness: There is abdominal tenderness (lower abdomen, non-focal).  Genitourinary:    Labia:        Right: No tenderness or lesion.        Left: No tenderness or lesion.      Vagina: Bleeding (scant) present. No vaginal discharge, erythema or tenderness.     Cervix: Normal. Cervical bleeding: scant.     Uterus: Normal.      Adnexa: Right adnexa normal and left adnexa normal.       Right: No mass or tenderness.         Left: No mass or tenderness.    Skin:    General: Skin is warm and dry.  Neurological:     Mental Status: She is alert.      ED Treatments / Results  Labs (all labs ordered are listed, but only abnormal results are displayed) Labs Reviewed  WET PREP, GENITAL - Abnormal; Notable for the following components:      Result Value   WBC, Wet Prep HPF POC RARE (*)    All other components within normal limits  COMPREHENSIVE METABOLIC PANEL - Abnormal; Notable for the following components:   CO2 21 (*)    AST 14 (*)    All other components within normal limits  URINALYSIS, ROUTINE W REFLEX MICROSCOPIC - Abnormal; Notable for the following components:   Hgb urine dipstick LARGE (*)    Bacteria, UA RARE (*)    All other components within normal limits  HCG, QUANTITATIVE, PREGNANCY - Abnormal; Notable  for the following components:   hCG, Beta Chain, Quant, S 11,034 (*)    All other components within normal limits  I-STAT BETA HCG BLOOD, ED ([facility], [facility], [facility] ONLY) - Abnormal; Notable for the following components:   I-stat hCG, quantitative >2,000.0 (*)    All other components within normal limits  LIPASE, BLOOD  CBC WITH DIFFERENTIAL/PLATELET  ABO/RH  GC/CHLAMYDIA PROBE AMP ([facility]) NOT AT [facility]    EKG None  Radiology US Ob Comp < 14 Wks  Result Date: 02/25/2018 CLINICAL DATA:  Initial evaluation for acute abdominal pain, vaginal bleeding. Possible early pregnancy. EXAM: OBSTETRIC <14 WK [place] AND TRANSVAGINAL OB US TECHNIQUE: Both transabdominal and transvaginal ultrasound examinations were performed for complete evaluation of the gestation as well as the maternal uterus, adnexal regions, and pelvic cul-de-sac. Transvaginal technique was performed to assess early pregnancy. COMPARISON:  Prior ultrasound from 12/21/2017 FINDINGS: Intrauterine gestational sac: Single Yolk sac:  Present Embryo:  Not visualized. Cardiac Activity: N/A Heart Rate: N/A  bpm MSD: 9.8 mm   5 w   5 d Subchorionic hemorrhage: Small subchorionic hemorrhage without mass effect. Maternal uterus/adnexae: Right ovary normal in appearance. 3.2 x 2.2 x 2.9 cm mildly complex cyst present within the left ovary. Small amount of free fluid within the pelvis. IMPRESSION: 1. Probable early intrauterine gestational sac with internal yolk sac, but no fetal pole or cardiac activity yet visualized. Estimated gestational age [redacted] weeks and 5 days by mean sac diameter. Recommend follow-up quantitative B-HCG levels and follow-up US in 14 days to assess viability. 2. Small subchorionic hemorrhage without mass effect. 3. 3.2 cm minimally complex left ovarian cyst. This has benign features common is almost certainly a mildly complex physiologic follicular cyst. Attention at follow-up suggested. Electronically Signed   By: Rise Mu M.D.   On:  02/25/2018 12:57   US Ob Transvaginal  Result Date: 02/25/2018 CLINICAL DATA:  Initial evaluation for acute abdominal pain, vaginal bleeding. Possible early pregnancy. EXAM: OBSTETRIC <14 WK [place] AND TRANSVAGINAL OB US TECHNIQUE: Both transabdominal and transvaginal ultrasound examinations were performed for complete evaluation of the gestation as well as the maternal uterus, adnexal regions, and pelvic cul-de-sac. Transvaginal technique was performed to assess early pregnancy. COMPARISON:  Prior ultrasound from 12/21/2017 FINDINGS: Intrauterine gestational sac: Single Yolk sac:  Present Embryo:  Not visualized. Cardiac Activity: N/A Heart Rate: N/A  bpm MSD: 9.8 mm   5 w   5 d Subchorionic hemorrhage: Small subchorionic hemorrhage without mass effect. Maternal uterus/adnexae: Right ovary normal in appearance. 3.2 x 2.2 x 2.9 cm mildly complex cyst present within the left ovary. Small amount of free fluid within the pelvis. IMPRESSION: 1. Probable early intrauterine gestational sac with internal yolk sac, but no fetal pole or cardiac activity yet visualized. Estimated gestational age [redacted] weeks and 5 days by mean sac diameter. Recommend follow-up quantitative B-HCG levels and follow-up US in 14 days to assess viability. 2. Small subchorionic hemorrhage without mass effect. 3. 3.2 cm minimally complex left ovarian cyst. This has benign features common is almost certainly a mildly complex physiologic follicular cyst. Attention at follow-up suggested. Electronically Signed   By: Rise Mu M.D.   On: 02/25/2018 12:57    Procedures Procedures (including critical care time)  Medications Ordered in ED Medications - No data to display   Initial Impression / Assessment and Plan / ED Course  I have reviewed the triage vital signs and the nursing notes.  Pertinent labs & imaging results that were available during my care of the patient were reviewed by me and considered in my medical decision making (see  chart for details).     Chelsea Wolf is a 38 year old female with no significant medical history who presents to the ED with vaginal bleeding, lower abdominal pain, recent positive pregnancy test.  Patient with unremarkable vitals.  No fever.  With lower abdominal pain for the last several hours with vaginal spotting.  She did have intercourse.  She states her last menstrual cycle was about 5 to 6 weeks ago.  Has prior pregnancy with no issues.  No history of miscarriage.  Denies any concerns for STDs.  Has had no fever.  Patient mildly tender in lower abdomen.  GU exam showed some scant vaginal bleeding, closed cervix.  No obvious tenderness.  Pregnancy test was positive.  Concern for possible ectopic pregnancy versus early miscarriage versus normal pregnancy.  Will get ultrasound to rule out ectopic pregnancy.  We will get basic labs including Rh status.  Patient is Rh+.  No need for RhoGam.  No significant leukocytosis, anemia, electrolyte abnormality.  Wet prep unremarkable.  Urinalysis showed no signs of infection.  Ultrasound showed likely early intrauterine gestational sac.  Estimated around 5 weeks and 5 days.  Patient made aware of need to follow-up quantitative hCG and a follow-up ultrasound and likely 14 days.  She has already arranged for OB follow-up.  Recommend starting prenatal vitamins, avoiding alcohol and drugs.  Patient discharged from the ED in good condition.  Given return precautions including worsening abdominal pain, heavy vaginal bleeding.  This chart was dictated using voice recognition software.  Despite best efforts to proofread,  errors can occur which can change the documentation meaning.  Final Clinical Impressions(s) / ED Diagnoses   Final diagnoses:  Vaginal bleeding  Vaginal bleeding in pregnancy    ED Discharge Orders    None       Virgina Norfolk, DO 02/25/18 1317

## 2018-02-25 NOTE — Discharge Instructions (Addendum)
Start prenatal vitamins, return to the ED if worsening abdominal pain, vaginal bleeding

## 2018-02-25 NOTE — ED Triage Notes (Signed)
Pt. Stated, Ive had 2 positive  Pregnancy test at home and this morning I had some bright red spotting, so I decided to come here.

## 2018-02-25 NOTE — ED Notes (Signed)
Patient verbalizes understanding of discharge instructions. Opportunity for questioning and answers were provided. Armband removed by staff, pt discharged from ED. Ambulated out into lobby  

## 2018-02-25 NOTE — ED Notes (Signed)
Patient transported to Ultrasound 

## 2018-02-26 LAB — GC/CHLAMYDIA PROBE AMP (~~LOC~~) NOT AT ARMC
CHLAMYDIA, DNA PROBE: NEGATIVE
NEISSERIA GONORRHEA: NEGATIVE

## 2018-02-28 ENCOUNTER — Ambulatory Visit (INDEPENDENT_AMBULATORY_CARE_PROVIDER_SITE_OTHER): Payer: Self-pay

## 2018-02-28 ENCOUNTER — Encounter: Payer: Self-pay | Admitting: Family Medicine

## 2018-02-28 DIAGNOSIS — Z3201 Encounter for pregnancy test, result positive: Secondary | ICD-10-CM

## 2018-02-28 LAB — POCT PREGNANCY, URINE: Preg Test, Ur: POSITIVE — AB

## 2018-02-28 NOTE — Progress Notes (Addendum)
Pt here today for pregnancy test.  Resulted positive.  Pt denies any bleeding or pain.  Pt reports LMP 01/17/18  EDD 10/24/18  6w today.  Medications reconciled.  List of medicines safe to take in pregnancy given.  Proof of pregnancy letter provided by the front office.   Reviewed the noted and agree with the plan of care.  Nolene Bernheim, RN, MSN, NP-BC Nurse Practitioner, [facility] for [employer], [facility] 03/01/2018 10:49 PM

## 2018-03-23 ENCOUNTER — Encounter: Payer: Self-pay | Admitting: *Deleted

## 2018-03-23 ENCOUNTER — Ambulatory Visit (INDEPENDENT_AMBULATORY_CARE_PROVIDER_SITE_OTHER): Payer: Medicaid Other | Admitting: *Deleted

## 2018-03-23 ENCOUNTER — Other Ambulatory Visit: Payer: Self-pay

## 2018-03-23 ENCOUNTER — Ambulatory Visit (INDEPENDENT_AMBULATORY_CARE_PROVIDER_SITE_OTHER): Payer: Medicaid Other | Admitting: Clinical

## 2018-03-23 ENCOUNTER — Other Ambulatory Visit (HOSPITAL_COMMUNITY)
Admission: RE | Admit: 2018-03-23 | Discharge: 2018-03-23 | Disposition: A | Discharge status: Home / Self Care | Payer: Medicaid Other | Source: Ambulatory Visit | Attending: Obstetrics and Gynecology | Admitting: Obstetrics and Gynecology

## 2018-03-23 VITALS — BP 109/65 | HR 85 | Wt 155.3 lb

## 2018-03-23 DIAGNOSIS — O99350 Diseases of the nervous system complicating pregnancy, unspecified trimester: Secondary | ICD-10-CM

## 2018-03-23 DIAGNOSIS — O0991 Supervision of high risk pregnancy, unspecified, first trimester: Secondary | ICD-10-CM | POA: Diagnosis not present

## 2018-03-23 DIAGNOSIS — O0990 Supervision of high risk pregnancy, unspecified, unspecified trimester: Secondary | ICD-10-CM

## 2018-03-23 DIAGNOSIS — G40909 Epilepsy, unspecified, not intractable, without status epilepticus: Secondary | ICD-10-CM | POA: Insufficient documentation

## 2018-03-23 DIAGNOSIS — O09219 Supervision of pregnancy with history of pre-term labor, unspecified trimester: Secondary | ICD-10-CM | POA: Insufficient documentation

## 2018-03-23 DIAGNOSIS — F430 Acute stress reaction: Secondary | ICD-10-CM | POA: Diagnosis not present

## 2018-03-23 DIAGNOSIS — Z658 Other specified problems related to psychosocial circumstances: Secondary | ICD-10-CM | POA: Diagnosis not present

## 2018-03-23 LAB — POCT URINALYSIS DIP (DEVICE)
BILIRUBIN URINE: NEGATIVE
Glucose, UA: NEGATIVE mg/dL
KETONES UR: NEGATIVE mg/dL
Nitrite: NEGATIVE
PH: 6 (ref 5.0–8.0)
Protein, ur: 30 mg/dL — AB
Urobilinogen, UA: 0.2 mg/dL (ref 0.0–1.0)

## 2018-03-23 NOTE — BH Specialist Note (Signed)
Integrated Behavioral Health Initial Visit  MRN: [id number] Name: Chelsea Wolf  Number of Integrated Behavioral Health Clinician visits:: 1/6 Session Start time: 11:48 Session End time: 12:07 Total time: 20 minutes  Type of Service: Integrated Behavioral Health- Individual/Family Interpretor:No. Interpretor Name and Language: n/a   Warm Hand Off Completed.       SUBJECTIVE: Chelsea Wolf is a 38 y.o. female accompanied by n/a Patient was referred by Nicholaus Bloom, MD for symptoms of depression and anxiety. Patient reports the following symptoms/concerns: Pt states her primary concern today is life stress (lost job 3 days prior to positive pregnancy); attributes symptoms to financial stress.  Duration of problem: Current pregnancy; Severity of problem: severe  OBJECTIVE: Mood: Depressed and Affect: Appropriate Risk of harm to self or others: No plan to harm self or others  LIFE CONTEXT: Family and Social: Pt has 14yo son in [place] School/Work: Pt recently lost full-time job Self-Care: - Life Changes: Current pregnancy and job loss  GOALS ADDRESSED: Patient will: 1. Reduce symptoms of: anxiety, depression and stress 2. Increase knowledge and/or ability of: healthy habits  3. Demonstrate ability to: Increase healthy adjustment to current life circumstances and Increase adequate support systems for patient/family  INTERVENTIONS: Interventions utilized: Psychoeducation and/or Health Education and Link to [employer]  Standardized Assessments completed: GAD-7 and PHQ 9  ASSESSMENT: Patient currently experiencing Acute stress reaction and Psychosocial    Patient may benefit from psychoeducation and brief therapeutic interventions regarding coping with symptoms of anxiety and depression .  PLAN: 1. Follow up with behavioral health clinician on : One month (next medical appointment) 2. Behavioral recommendations:  -Continue using meditation apps daily -Read  educational materials regarding coping with symptoms of anxiety with panic attacks and depression -Consider community resources as additional support(financial, housing, job loss, etc)  3. Referral(s): Integrated Art gallery manager (In Clinic) and [employer]:  [employer]; [employer] 4. "From scale of 1-10, how likely are you to follow plan?": 10  Rae Lips, LCSW  Depression screen [facility] 2/9 03/23/2018  Decreased Interest 3  Down, Depressed, Hopeless 3  PHQ - 2 Score 6  Altered sleeping 3  Tired, decreased energy 3  Change in appetite 3  Feeling bad or failure about yourself  3  Trouble concentrating 3  Moving slowly or fidgety/restless 3  Suicidal thoughts 0  PHQ-9 Score 24   GAD 7 : Generalized Anxiety Score 03/23/2018  Nervous, Anxious, on Edge 3  Control/stop worrying 3  Worry too much - different things 3  Trouble relaxing 3  Restless 3  Easily annoyed or irritable 3  Afraid - awful might happen 3  Total GAD 7 Score 21

## 2018-03-23 NOTE — Progress Notes (Signed)
New Ob intake completed and pregnancy information packet given. Pt reports having N&V - does not take any medications by choice. List of approved OTC meds given and discussed that pt may try Sea Bands.for nausea. Labs drawn and  Medicaid Home form completed. Pt declined flu vaccine. Initial prenatal visit scheduled on 2/20.

## 2018-03-24 LAB — GC/CHLAMYDIA PROBE AMP (~~LOC~~) NOT AT ARMC
Chlamydia: NEGATIVE
Neisseria Gonorrhea: NEGATIVE

## 2018-03-27 LAB — URINE CULTURE, OB REFLEX

## 2018-03-27 LAB — CULTURE, OB URINE

## 2018-04-02 ENCOUNTER — Telehealth: Payer: Self-pay | Admitting: Obstetrics and Gynecology

## 2018-04-02 MED ORDER — CEPHALEXIN 500 MG PO CAPS: 500.0000 mg | ORAL_CAPSULE | Freq: Four times a day (QID) | ORAL | 0 refills | Status: AC

## 2018-04-02 NOTE — Telephone Encounter (Signed)
+   urine culture on NEW OB intake She needs treatment. Rx for Keflex sent to pharmacy. No answer when I called. Please inform the patient if she calls back. Thank you,   Venia Carbon, NP

## 2018-04-03 LAB — OBSTETRIC PANEL, INCLUDING HIV
Antibody Screen: NEGATIVE
BASOS ABS: 0 10*3/uL (ref 0.0–0.2)
Basos: 0 %
EOS (ABSOLUTE): 0 10*3/uL (ref 0.0–0.4)
Eos: 1 %
HIV Screen 4th Generation wRfx: NONREACTIVE
Hematocrit: 34.2 % (ref 34.0–46.6)
Hemoglobin: 11.7 g/dL (ref 11.1–15.9)
Hepatitis B Surface Ag: NEGATIVE
IMMATURE GRANULOCYTES: 0 %
Immature Grans (Abs): 0 10*3/uL (ref 0.0–0.1)
Lymphocytes Absolute: 1.7 10*3/uL (ref 0.7–3.1)
Lymphs: 26 %
MCH: 29.8 pg (ref 26.6–33.0)
MCHC: 34.2 g/dL (ref 31.5–35.7)
MCV: 87 fL (ref 79–97)
Monocytes Absolute: 0.5 10*3/uL (ref 0.1–0.9)
Monocytes: 8 %
Neutrophils Absolute: 4.3 10*3/uL (ref 1.4–7.0)
Neutrophils: 65 %
PLATELETS: 197 10*3/uL (ref 150–450)
RBC: 3.92 x10E6/uL (ref 3.77–5.28)
RDW: 14.1 % (ref 11.7–15.4)
RPR Ser Ql: NONREACTIVE
Rh Factor: POSITIVE
Rubella Antibodies, IGG: 2.51 index (ref >0.99)
WBC: 6.6 10*3/uL (ref 3.4–10.8)

## 2018-04-03 LAB — HEMOGLOBINOPATHY EVALUATION
Ferritin: 9 ng/mL — ABNORMAL LOW (ref 15–150)
HGB A2 QUANT: 2.2 % (ref 1.8–3.2)
Hgb A: 97.8 % (ref 96.4–98.8)
Hgb C: 0 %
Hgb F Quant: 0 % (ref 0.0–2.0)
Hgb S: 0 %
Hgb Solubility: NEGATIVE
Hgb Variant: 0 %

## 2018-04-03 LAB — INHERITEST(R) CF/SMA PANEL

## 2018-04-05 ENCOUNTER — Encounter: Payer: Medicaid Other | Admitting: Obstetrics and Gynecology

## 2018-04-06 ENCOUNTER — Encounter: Payer: Medicaid Other | Admitting: Family Medicine

## 2018-04-18 ENCOUNTER — Encounter: Payer: Medicaid Other | Admitting: Family Medicine

## 2018-05-08 ENCOUNTER — Encounter: Payer: Self-pay | Admitting: *Deleted

## 2019-01-16 ENCOUNTER — Encounter (HOSPITAL_COMMUNITY): Payer: Self-pay

## 2020-08-07 IMAGING — US US ART/VEN ABD/PELV/SCROTUM DOPPLER LTD
1 series · 13 of 25 positions shown · non-contrast
Comparison: None.

CLINICAL DATA: Pelvic pain

EXAM:
TRANSABDOMINAL AND TRANSVAGINAL ULTRASOUND OF PELVIS
DOPPLER ULTRASOUND OF OVARIES
TECHNIQUE: Both transabdominal and transvaginal ultrasound examinations of the
pelvis were performed. Transabdominal technique was performed for
global imaging of the pelvis including uterus, ovaries, adnexal
regions, and pelvic cul-de-sac.
It was necessary to proceed with endovaginal exam following the
transabdominal exam to visualize the ovaries. Color and duplex
Doppler ultrasound was utilized to evaluate blood flow to the
ovaries.

[Series 1: us art/ven abd/pelv/scrotum doppler ltd · 0.24mm/px · 13 of 77 slices shown]
[im 1/77]
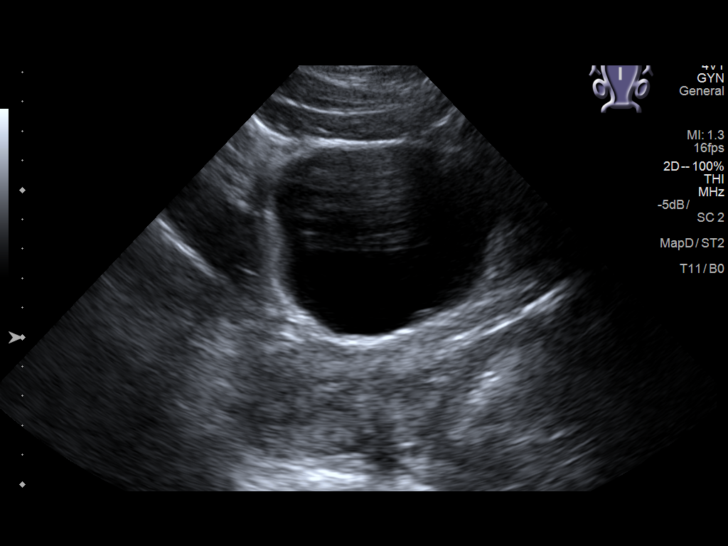
[im 7/77]
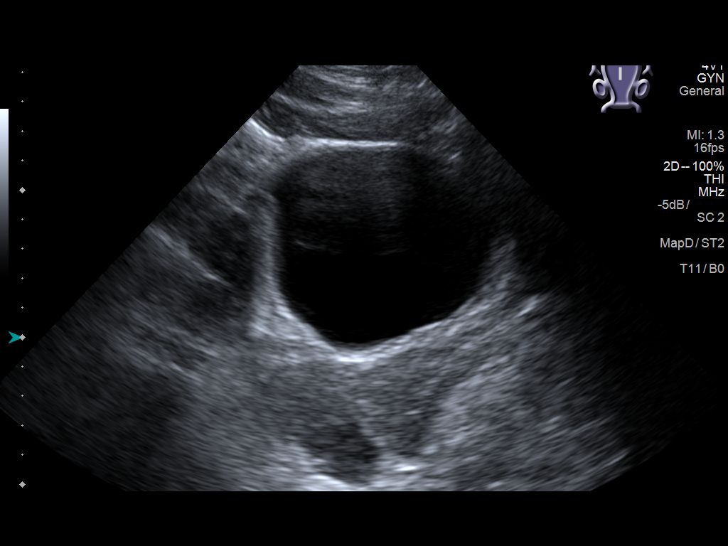
[im 13/77]
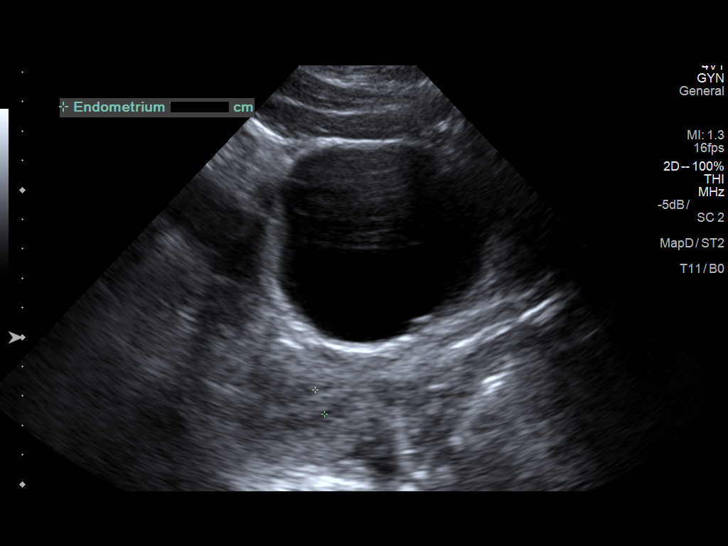
[im 20/77]
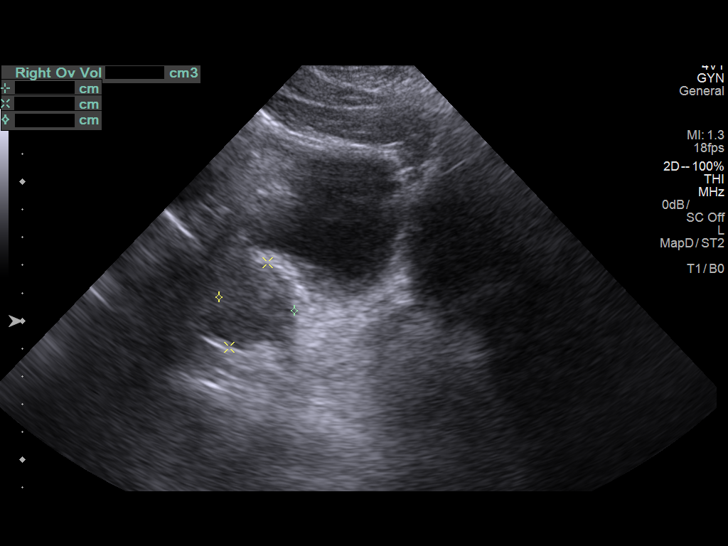
[im 26/77]
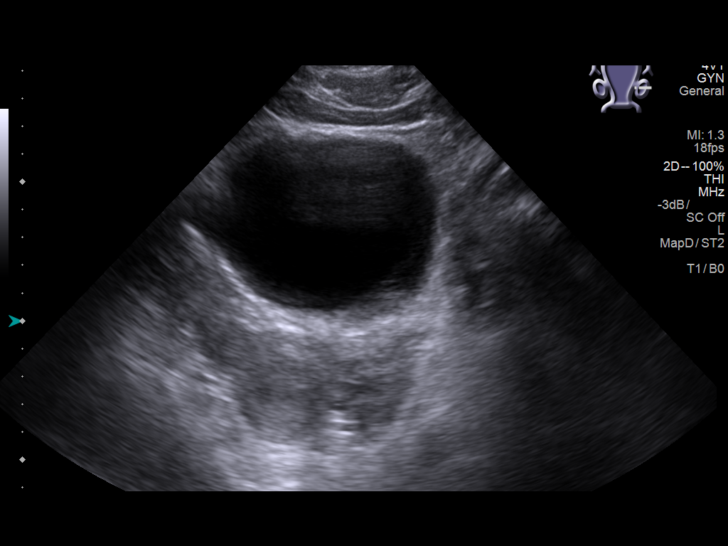
[im 32/77]
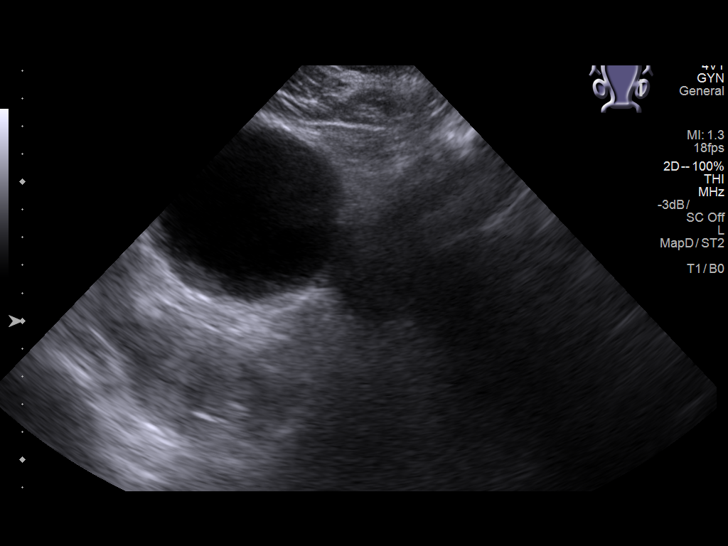
[im 39/77]
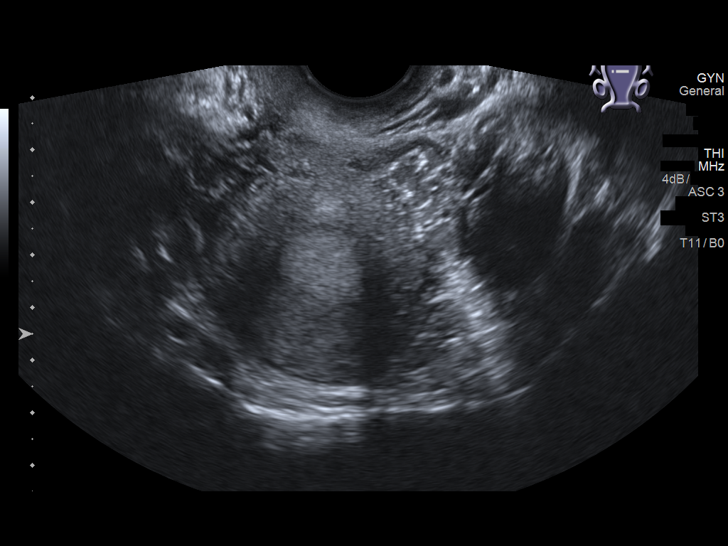
[im 45/77]
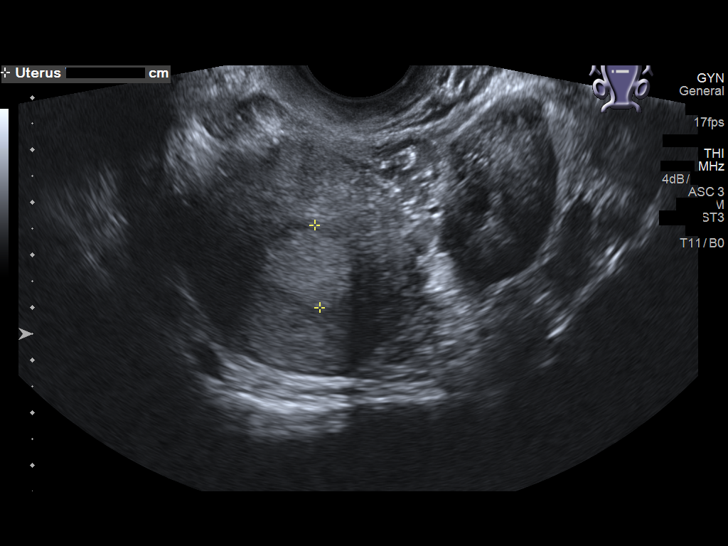
[im 51/77]
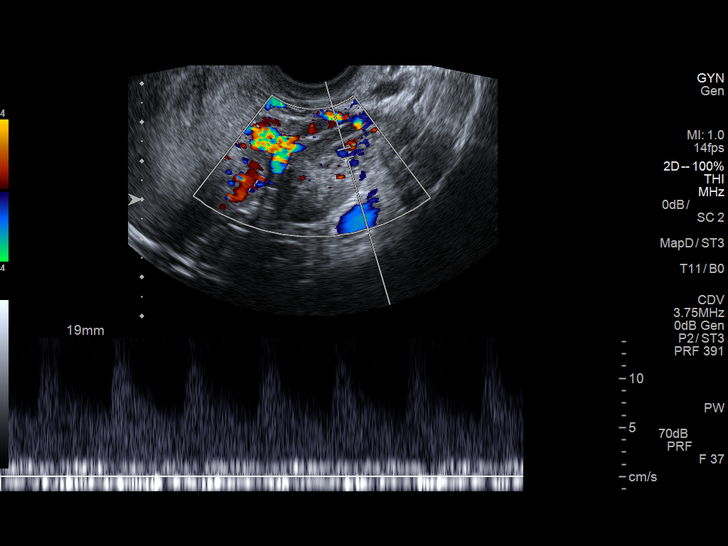
[im 58/77]
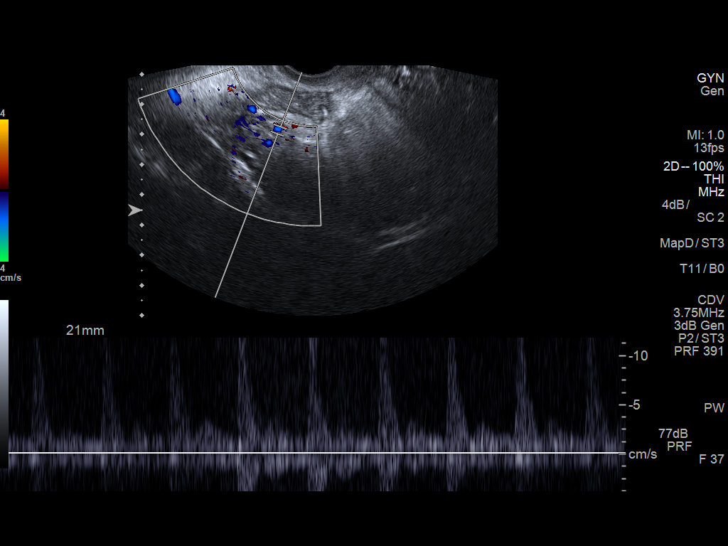
[im 64/77]
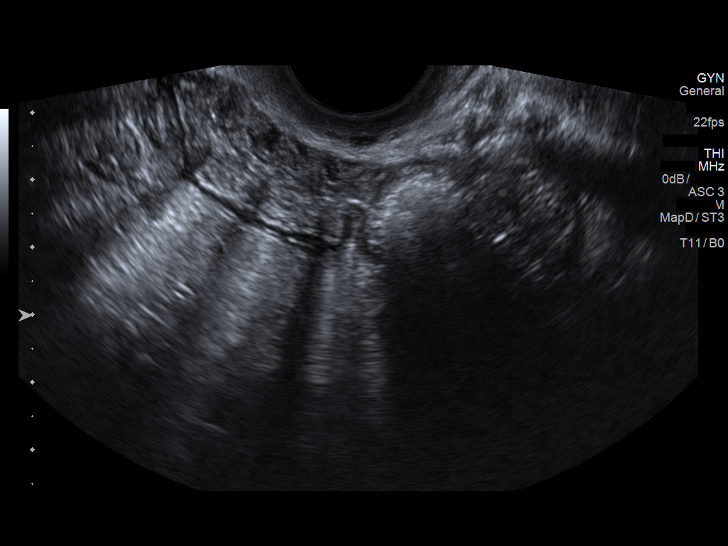
[im 70/77]
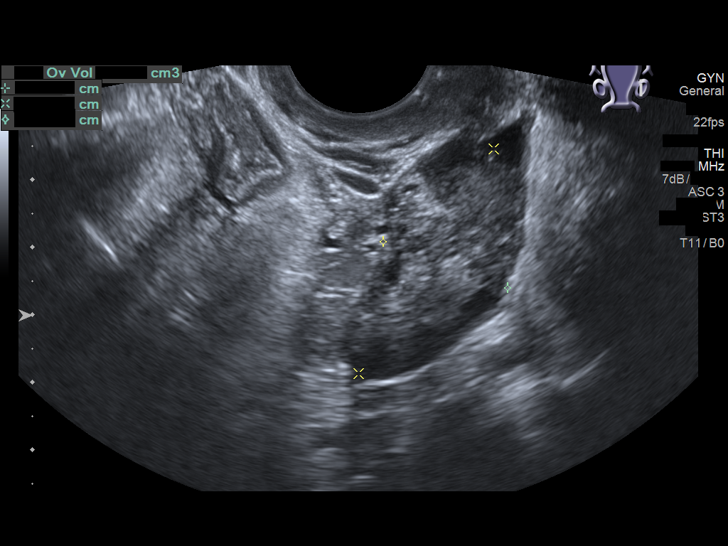
[im 77/77]
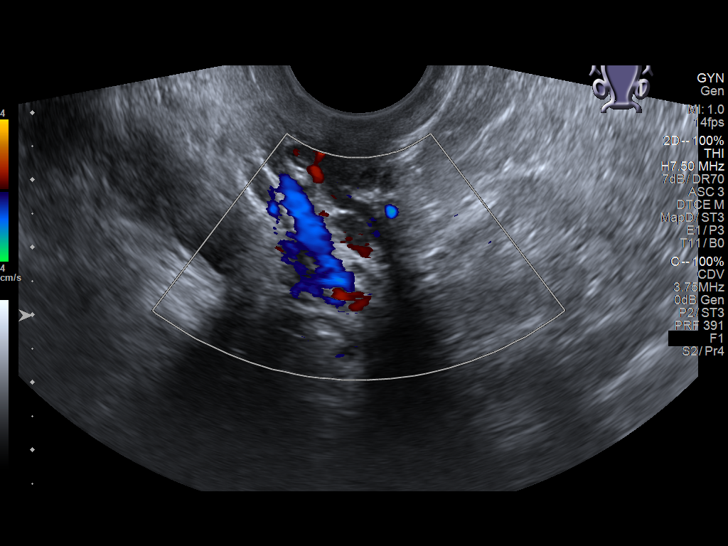

[13 of 25 positions shown; findings below may reference images not displayed]

FINDINGS: Uterus

Measurements: Normal in size at 7.9 x 3.3 x 4.3 cm = volume: 60 mL.
No fibroids or other mass visualized.

Endometrium

Thickness: 15 mm. There is normal thickness for premenopausal
female.

Right ovary

Measurements: 3.2 x 1.7 x 1.6 cm (volume = 4.6 cm^3) . Normal
appearance/no adnexal mass.m

Left ovary

Measurements: 3.8 by 2.1 x 1.9 cm (volume = 7.9 cm^3) =. Normal
appearance/no adnexal mass. Smaller free fluid in the LEFT adnexa.

Pulsed Doppler evaluation of both ovaries demonstrates normal
low-resistance arterial and venous waveforms.

Other findings

No abnormal free fluid.
IMPRESSION: 1. Normal uterus and ovaries.
2. Small amount free fluid in the LEFT adnexa.
